# Patient Record
Sex: Male | Born: 1945 | Race: White | Hispanic: No | Marital: Married | Smoking: Former smoker
Health system: Southern US, Community
[De-identification: ages and names within clinical notes are randomized; demographics above are authoritative.]

## PROBLEM LIST (undated history)

## (undated) DIAGNOSIS — I10 Essential (primary) hypertension: Secondary | ICD-10-CM

## (undated) DIAGNOSIS — I259 Chronic ischemic heart disease, unspecified: Secondary | ICD-10-CM

## (undated) DIAGNOSIS — E119 Type 2 diabetes mellitus without complications: Secondary | ICD-10-CM

## (undated) DIAGNOSIS — R972 Elevated prostate specific antigen [PSA]: Secondary | ICD-10-CM

## (undated) DIAGNOSIS — D510 Vitamin B12 deficiency anemia due to intrinsic factor deficiency: Secondary | ICD-10-CM

## (undated) DIAGNOSIS — E669 Obesity, unspecified: Secondary | ICD-10-CM

## (undated) DIAGNOSIS — E1165 Type 2 diabetes mellitus with hyperglycemia: Secondary | ICD-10-CM

## (undated) DIAGNOSIS — N189 Chronic kidney disease, unspecified: Secondary | ICD-10-CM

## (undated) DIAGNOSIS — E1129 Type 2 diabetes mellitus with other diabetic kidney complication: Secondary | ICD-10-CM

## (undated) DIAGNOSIS — B359 Dermatophytosis, unspecified: Secondary | ICD-10-CM

## (undated) DIAGNOSIS — E782 Mixed hyperlipidemia: Secondary | ICD-10-CM

## (undated) HISTORY — DX: Dermatophytosis, unspecified: B35.9

## (undated) HISTORY — DX: Chronic kidney disease, unspecified: N18.9

## (undated) HISTORY — DX: Type 2 diabetes mellitus with hyperglycemia: E11.65

## (undated) HISTORY — DX: Mixed hyperlipidemia: E78.2

## (undated) HISTORY — DX: Vitamin B12 deficiency anemia due to intrinsic factor deficiency: D51.0

## (undated) HISTORY — PX: CARDIAC SURGERY: SHX584

## (undated) HISTORY — DX: Chronic ischemic heart disease, unspecified: I25.9

## (undated) HISTORY — DX: Type 2 diabetes mellitus with other diabetic kidney complication: E11.29

## (undated) HISTORY — DX: Obesity, unspecified: E66.9

## (undated) HISTORY — DX: Essential (primary) hypertension: I10

## (undated) HISTORY — DX: Elevated prostate specific antigen (PSA): R97.20

---

## 1987-10-31 HISTORY — PX: ANGIOPLASTY: SHX39

## 2006-10-30 HISTORY — PX: CARDIAC CATHETERIZATION: SHX172

## 2009-04-20 ENCOUNTER — Emergency Department (HOSPITAL_COMMUNITY): Admission: EM | Admit: 2009-04-20 | Discharge: 2009-04-20 | Payer: Self-pay | Admitting: Emergency Medicine

## 2011-02-06 LAB — URINALYSIS, ROUTINE W REFLEX MICROSCOPIC
Leukocytes, UA: NEGATIVE
Nitrite: NEGATIVE
Specific Gravity, Urine: 1.022 (ref 1.005–1.030)
pH: 5 (ref 5.0–8.0)

## 2011-02-06 LAB — GLUCOSE, CAPILLARY

## 2011-02-06 LAB — DIFFERENTIAL
Basophils Absolute: 0.1 10*3/uL (ref 0.0–0.1)
Lymphocytes Relative: 32 % (ref 12–46)
Lymphs Abs: 2.4 10*3/uL (ref 0.7–4.0)
Monocytes Absolute: 0.6 10*3/uL (ref 0.1–1.0)
Neutro Abs: 4.3 10*3/uL (ref 1.7–7.7)

## 2011-02-06 LAB — BASIC METABOLIC PANEL
Calcium: 9.4 mg/dL (ref 8.4–10.5)
GFR calc Af Amer: >60 mL/min (ref >60)
GFR calc non Af Amer: 50 mL/min — ABNORMAL LOW (ref >60)
Glucose, Bld: 199 mg/dL — ABNORMAL HIGH (ref 70–99)
Sodium: 137 mEq/L (ref 135–145)

## 2011-02-06 LAB — URINE MICROSCOPIC-ADD ON

## 2011-02-06 LAB — CBC
Hemoglobin: 14.1 g/dL (ref 13.0–17.0)
RDW: 13 % (ref 11.5–15.5)
WBC: 7.5 10*3/uL (ref 4.0–10.5)

## 2014-09-17 ENCOUNTER — Other Ambulatory Visit: Payer: Self-pay | Admitting: Nephrology

## 2014-09-17 DIAGNOSIS — I10 Essential (primary) hypertension: Secondary | ICD-10-CM

## 2014-09-17 DIAGNOSIS — N183 Chronic kidney disease, stage 3 unspecified: Secondary | ICD-10-CM

## 2016-12-05 DIAGNOSIS — I10 Essential (primary) hypertension: Secondary | ICD-10-CM | POA: Diagnosis not present

## 2016-12-05 DIAGNOSIS — N189 Chronic kidney disease, unspecified: Secondary | ICD-10-CM | POA: Diagnosis not present

## 2016-12-05 DIAGNOSIS — R972 Elevated prostate specific antigen [PSA]: Secondary | ICD-10-CM | POA: Diagnosis not present

## 2016-12-05 DIAGNOSIS — E1165 Type 2 diabetes mellitus with hyperglycemia: Secondary | ICD-10-CM | POA: Diagnosis not present

## 2017-04-11 DIAGNOSIS — I10 Essential (primary) hypertension: Secondary | ICD-10-CM | POA: Diagnosis not present

## 2017-04-11 DIAGNOSIS — E1165 Type 2 diabetes mellitus with hyperglycemia: Secondary | ICD-10-CM | POA: Diagnosis not present

## 2017-04-11 DIAGNOSIS — Z6829 Body mass index (BMI) 29.0-29.9, adult: Secondary | ICD-10-CM | POA: Diagnosis not present

## 2017-04-11 DIAGNOSIS — Z1389 Encounter for screening for other disorder: Secondary | ICD-10-CM | POA: Diagnosis not present

## 2017-06-25 DIAGNOSIS — E1165 Type 2 diabetes mellitus with hyperglycemia: Secondary | ICD-10-CM | POA: Diagnosis not present

## 2017-07-25 DIAGNOSIS — E119 Type 2 diabetes mellitus without complications: Secondary | ICD-10-CM | POA: Diagnosis not present

## 2017-07-25 DIAGNOSIS — I10 Essential (primary) hypertension: Secondary | ICD-10-CM | POA: Diagnosis not present

## 2017-07-25 DIAGNOSIS — N183 Chronic kidney disease, stage 3 (moderate): Secondary | ICD-10-CM | POA: Diagnosis not present

## 2017-07-25 DIAGNOSIS — N400 Enlarged prostate without lower urinary tract symptoms: Secondary | ICD-10-CM | POA: Diagnosis not present

## 2017-07-25 DIAGNOSIS — E669 Obesity, unspecified: Secondary | ICD-10-CM | POA: Diagnosis not present

## 2017-08-16 DIAGNOSIS — Z9181 History of falling: Secondary | ICD-10-CM | POA: Diagnosis not present

## 2017-08-16 DIAGNOSIS — Z6830 Body mass index (BMI) 30.0-30.9, adult: Secondary | ICD-10-CM | POA: Diagnosis not present

## 2017-08-16 DIAGNOSIS — Z1339 Encounter for screening examination for other mental health and behavioral disorders: Secondary | ICD-10-CM | POA: Diagnosis not present

## 2017-08-16 DIAGNOSIS — Z0000 Encounter for general adult medical examination without abnormal findings: Secondary | ICD-10-CM | POA: Diagnosis not present

## 2017-08-16 DIAGNOSIS — Z23 Encounter for immunization: Secondary | ICD-10-CM | POA: Diagnosis not present

## 2017-10-19 DIAGNOSIS — R972 Elevated prostate specific antigen [PSA]: Secondary | ICD-10-CM | POA: Diagnosis not present

## 2017-11-12 DIAGNOSIS — E1165 Type 2 diabetes mellitus with hyperglycemia: Secondary | ICD-10-CM | POA: Diagnosis not present

## 2017-11-12 DIAGNOSIS — Z6830 Body mass index (BMI) 30.0-30.9, adult: Secondary | ICD-10-CM | POA: Diagnosis not present

## 2017-11-12 DIAGNOSIS — R5383 Other fatigue: Secondary | ICD-10-CM | POA: Diagnosis not present

## 2017-11-12 DIAGNOSIS — I10 Essential (primary) hypertension: Secondary | ICD-10-CM | POA: Diagnosis not present

## 2018-02-25 DIAGNOSIS — Z6830 Body mass index (BMI) 30.0-30.9, adult: Secondary | ICD-10-CM | POA: Diagnosis not present

## 2018-02-25 DIAGNOSIS — E1165 Type 2 diabetes mellitus with hyperglycemia: Secondary | ICD-10-CM | POA: Diagnosis not present

## 2018-02-25 DIAGNOSIS — M79671 Pain in right foot: Secondary | ICD-10-CM | POA: Diagnosis not present

## 2018-02-25 DIAGNOSIS — I10 Essential (primary) hypertension: Secondary | ICD-10-CM | POA: Diagnosis not present

## 2018-06-03 DIAGNOSIS — E1165 Type 2 diabetes mellitus with hyperglycemia: Secondary | ICD-10-CM | POA: Diagnosis not present

## 2018-06-10 DIAGNOSIS — N189 Chronic kidney disease, unspecified: Secondary | ICD-10-CM | POA: Diagnosis not present

## 2018-06-10 DIAGNOSIS — E1165 Type 2 diabetes mellitus with hyperglycemia: Secondary | ICD-10-CM | POA: Diagnosis not present

## 2018-06-10 DIAGNOSIS — Z1331 Encounter for screening for depression: Secondary | ICD-10-CM | POA: Diagnosis not present

## 2018-06-10 DIAGNOSIS — Z6828 Body mass index (BMI) 28.0-28.9, adult: Secondary | ICD-10-CM | POA: Diagnosis not present

## 2018-06-10 DIAGNOSIS — Z1211 Encounter for screening for malignant neoplasm of colon: Secondary | ICD-10-CM | POA: Diagnosis not present

## 2018-07-24 DIAGNOSIS — Z23 Encounter for immunization: Secondary | ICD-10-CM | POA: Diagnosis not present

## 2018-08-07 DIAGNOSIS — Z1211 Encounter for screening for malignant neoplasm of colon: Secondary | ICD-10-CM | POA: Diagnosis not present

## 2018-08-09 DIAGNOSIS — N400 Enlarged prostate without lower urinary tract symptoms: Secondary | ICD-10-CM | POA: Diagnosis not present

## 2018-08-09 DIAGNOSIS — E1122 Type 2 diabetes mellitus with diabetic chronic kidney disease: Secondary | ICD-10-CM | POA: Diagnosis not present

## 2018-08-09 DIAGNOSIS — N183 Chronic kidney disease, stage 3 (moderate): Secondary | ICD-10-CM | POA: Diagnosis not present

## 2018-08-09 DIAGNOSIS — I129 Hypertensive chronic kidney disease with stage 1 through stage 4 chronic kidney disease, or unspecified chronic kidney disease: Secondary | ICD-10-CM | POA: Diagnosis not present

## 2018-08-09 DIAGNOSIS — E669 Obesity, unspecified: Secondary | ICD-10-CM | POA: Diagnosis not present

## 2018-08-26 DIAGNOSIS — E113293 Type 2 diabetes mellitus with mild nonproliferative diabetic retinopathy without macular edema, bilateral: Secondary | ICD-10-CM | POA: Diagnosis not present

## 2018-08-26 DIAGNOSIS — H40003 Preglaucoma, unspecified, bilateral: Secondary | ICD-10-CM | POA: Diagnosis not present

## 2018-08-26 DIAGNOSIS — H5203 Hypermetropia, bilateral: Secondary | ICD-10-CM | POA: Diagnosis not present

## 2018-09-11 DIAGNOSIS — E1165 Type 2 diabetes mellitus with hyperglycemia: Secondary | ICD-10-CM | POA: Diagnosis not present

## 2018-09-11 DIAGNOSIS — D510 Vitamin B12 deficiency anemia due to intrinsic factor deficiency: Secondary | ICD-10-CM | POA: Diagnosis not present

## 2018-09-11 DIAGNOSIS — Z0000 Encounter for general adult medical examination without abnormal findings: Secondary | ICD-10-CM | POA: Diagnosis not present

## 2018-09-11 DIAGNOSIS — I259 Chronic ischemic heart disease, unspecified: Secondary | ICD-10-CM | POA: Diagnosis not present

## 2018-09-11 DIAGNOSIS — I10 Essential (primary) hypertension: Secondary | ICD-10-CM | POA: Diagnosis not present

## 2018-09-11 DIAGNOSIS — N189 Chronic kidney disease, unspecified: Secondary | ICD-10-CM | POA: Diagnosis not present

## 2018-09-11 DIAGNOSIS — Z6828 Body mass index (BMI) 28.0-28.9, adult: Secondary | ICD-10-CM | POA: Diagnosis not present

## 2018-09-11 DIAGNOSIS — Z1339 Encounter for screening examination for other mental health and behavioral disorders: Secondary | ICD-10-CM | POA: Diagnosis not present

## 2018-09-28 DIAGNOSIS — N189 Chronic kidney disease, unspecified: Secondary | ICD-10-CM | POA: Diagnosis not present

## 2018-09-28 DIAGNOSIS — D510 Vitamin B12 deficiency anemia due to intrinsic factor deficiency: Secondary | ICD-10-CM | POA: Diagnosis not present

## 2018-09-28 DIAGNOSIS — E1165 Type 2 diabetes mellitus with hyperglycemia: Secondary | ICD-10-CM | POA: Diagnosis not present

## 2018-09-30 DIAGNOSIS — L259 Unspecified contact dermatitis, unspecified cause: Secondary | ICD-10-CM | POA: Diagnosis not present

## 2018-09-30 DIAGNOSIS — I10 Essential (primary) hypertension: Secondary | ICD-10-CM | POA: Diagnosis not present

## 2018-09-30 DIAGNOSIS — Z6829 Body mass index (BMI) 29.0-29.9, adult: Secondary | ICD-10-CM | POA: Diagnosis not present

## 2018-10-02 DIAGNOSIS — E1165 Type 2 diabetes mellitus with hyperglycemia: Secondary | ICD-10-CM | POA: Diagnosis not present

## 2018-10-16 DIAGNOSIS — E1165 Type 2 diabetes mellitus with hyperglycemia: Secondary | ICD-10-CM | POA: Diagnosis not present

## 2018-10-29 DIAGNOSIS — N189 Chronic kidney disease, unspecified: Secondary | ICD-10-CM | POA: Diagnosis not present

## 2018-10-29 DIAGNOSIS — I10 Essential (primary) hypertension: Secondary | ICD-10-CM | POA: Diagnosis not present

## 2018-10-29 DIAGNOSIS — E785 Hyperlipidemia, unspecified: Secondary | ICD-10-CM | POA: Diagnosis not present

## 2018-11-17 ENCOUNTER — Emergency Department (HOSPITAL_COMMUNITY)
Admission: EM | Admit: 2018-11-17 | Discharge: 2018-11-17 | Disposition: A | Discharge status: Home / Self Care | Payer: PPO | Attending: Emergency Medicine | Admitting: Emergency Medicine

## 2018-11-17 ENCOUNTER — Other Ambulatory Visit: Payer: Self-pay

## 2018-11-17 ENCOUNTER — Encounter (HOSPITAL_COMMUNITY): Payer: Self-pay

## 2018-11-17 ENCOUNTER — Emergency Department (HOSPITAL_COMMUNITY): Payer: PPO

## 2018-11-17 DIAGNOSIS — I10 Essential (primary) hypertension: Secondary | ICD-10-CM | POA: Diagnosis not present

## 2018-11-17 DIAGNOSIS — Z794 Long term (current) use of insulin: Secondary | ICD-10-CM | POA: Insufficient documentation

## 2018-11-17 DIAGNOSIS — E119 Type 2 diabetes mellitus without complications: Secondary | ICD-10-CM | POA: Diagnosis not present

## 2018-11-17 DIAGNOSIS — R001 Bradycardia, unspecified: Secondary | ICD-10-CM | POA: Diagnosis not present

## 2018-11-17 DIAGNOSIS — R42 Dizziness and giddiness: Secondary | ICD-10-CM | POA: Diagnosis not present

## 2018-11-17 DIAGNOSIS — E1165 Type 2 diabetes mellitus with hyperglycemia: Secondary | ICD-10-CM | POA: Diagnosis not present

## 2018-11-17 DIAGNOSIS — R55 Syncope and collapse: Secondary | ICD-10-CM | POA: Insufficient documentation

## 2018-11-17 DIAGNOSIS — Z87891 Personal history of nicotine dependence: Secondary | ICD-10-CM | POA: Diagnosis not present

## 2018-11-17 DIAGNOSIS — N179 Acute kidney failure, unspecified: Secondary | ICD-10-CM | POA: Diagnosis not present

## 2018-11-17 DIAGNOSIS — I959 Hypotension, unspecified: Secondary | ICD-10-CM | POA: Diagnosis not present

## 2018-11-17 HISTORY — DX: Essential (primary) hypertension: I10

## 2018-11-17 HISTORY — DX: Type 2 diabetes mellitus without complications: E11.9

## 2018-11-17 LAB — URINALYSIS, ROUTINE W REFLEX MICROSCOPIC
Bilirubin Urine: NEGATIVE
Glucose, UA: >=500 mg/dL — AB
Hgb urine dipstick: NEGATIVE
Ketones, ur: NEGATIVE mg/dL
Leukocytes, UA: NEGATIVE
Nitrite: NEGATIVE
Protein, ur: NEGATIVE mg/dL
Specific Gravity, Urine: 1.023 (ref 1.005–1.030)
pH: 5 (ref 5.0–8.0)

## 2018-11-17 LAB — CBC WITH DIFFERENTIAL/PLATELET
ABS IMMATURE GRANULOCYTES: 0.02 10*3/uL (ref 0.00–0.07)
Basophils Absolute: 0.1 10*3/uL (ref 0.0–0.1)
Basophils Relative: 1 %
EOS ABS: 0.1 10*3/uL (ref 0.0–0.5)
Eosinophils Relative: 1 %
HCT: 41.1 % (ref 39.0–52.0)
Hemoglobin: 13.1 g/dL (ref 13.0–17.0)
Immature Granulocytes: 0 %
Lymphocytes Relative: 15 %
Lymphs Abs: 1.2 10*3/uL (ref 0.7–4.0)
MCH: 30.5 pg (ref 26.0–34.0)
MCHC: 31.9 g/dL (ref 30.0–36.0)
MCV: 95.8 fL (ref 80.0–100.0)
Monocytes Absolute: 0.6 10*3/uL (ref 0.1–1.0)
Monocytes Relative: 7 %
Neutro Abs: 5.8 10*3/uL (ref 1.7–7.7)
Neutrophils Relative %: 76 %
Platelets: 154 10*3/uL (ref 150–400)
RBC: 4.29 MIL/uL (ref 4.22–5.81)
RDW: 11.9 % (ref 11.5–15.5)
WBC: 7.7 10*3/uL (ref 4.0–10.5)
nRBC: 0 % (ref 0.0–0.2)

## 2018-11-17 LAB — I-STAT TROPONIN, ED: Troponin i, poc: 0.01 ng/mL (ref 0.00–0.08)

## 2018-11-17 LAB — COMPREHENSIVE METABOLIC PANEL
ALBUMIN: 3.4 g/dL — AB (ref 3.5–5.0)
ALT: 20 U/L (ref 0–44)
AST: 18 U/L (ref 15–41)
Alkaline Phosphatase: 65 U/L (ref 38–126)
Anion gap: 8 (ref 5–15)
BUN: 33 mg/dL — ABNORMAL HIGH (ref 8–23)
CO2: 19 mmol/L — ABNORMAL LOW (ref 22–32)
CREATININE: 1.79 mg/dL — AB (ref 0.61–1.24)
Calcium: 8.4 mg/dL — ABNORMAL LOW (ref 8.9–10.3)
Chloride: 109 mmol/L (ref 98–111)
GFR calc Af Amer: 43 mL/min — ABNORMAL LOW (ref >60)
GFR calc non Af Amer: 37 mL/min — ABNORMAL LOW (ref >60)
Glucose, Bld: 301 mg/dL — ABNORMAL HIGH (ref 70–99)
Potassium: 5 mmol/L (ref 3.5–5.1)
Sodium: 136 mmol/L (ref 135–145)
Total Bilirubin: 0.6 mg/dL (ref 0.3–1.2)
Total Protein: 5.9 g/dL — ABNORMAL LOW (ref 6.5–8.1)

## 2018-11-17 MED ORDER — SODIUM CHLORIDE 0.9 % IV BOLUS
1000.0000 mL | Freq: Once | INTRAVENOUS | Status: AC
  Administered 2018-11-17: 1000 mL via INTRAVENOUS

## 2018-11-17 NOTE — ED Notes (Signed)
Patient transported to X-ray 

## 2018-11-17 NOTE — ED Notes (Signed)
Pt alert and oriented in NAD. Pt verbalized understanding of discharge instructions. 

## 2018-11-17 NOTE — Discharge Instructions (Signed)
Hold your blood pressure medicines for now. Especially hold your atenolol.   Your kidney function is slightly abnormal so you need to stay hydrated/.    Call your doctor tomorrow for follow up in 1-2 days. Hold off taking your blood pressure medicines until you talk to you doctor  Return to ER if you have dizziness, passing out, chest pain, palpitations, trouble breathing

## 2018-11-17 NOTE — ED Provider Notes (Addendum)
[facility] EMERGENCY DEPARTMENT Provider Note   CSN: [id number] Arrival date & time: 11/17/18  1153     History   Chief Complaint Chief Complaint  Patient presents with  . Loss of Consciousness    HPI Brady [name] is a 73 y.o. male hx of DM, HTN, here presenting with dizziness, hypotension, near syncope.  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  States that he is compliant with his atenolol as well as clonidine and took it this morning.  He states that he then went to church and felt very lightheaded and dizzy when he got up and almost passed out.  He states that his wife lowered him to the floor and he did not hit his head.  He felt nauseated at that time but denies any abdominal pain or chest pain or palpitations.  Patient was noted to be hypotensive with blood pressure 78 over palp initially and was given 700 cc by EMS and BP was 126/55 on arrival. Denies vomiting or fevers.   The history is provided by the patient.    Past Medical History:  Diagnosis Date  . Diabetes mellitus without complication ([place])   . Hypertension     There are no active problems to display for this patient.     Home Medications    Prior to Admission medications   Medication Sig Start Date End Date Taking? Authorizing Provider  amLODipine (NORVASC) 10 MG tablet Take 10 mg by mouth daily. 08/20/18  Yes [provider]  atenolol (TENORMIN) 25 MG tablet Take 25 mg by mouth daily. 08/28/18  Yes [provider]  benazepril (LOTENSIN) 40 MG tablet Take 40 mg by mouth daily. 08/28/18  Yes [provider]  canagliflozin (INVOKANA) 100 MG TABS tablet Take 100 mg by mouth daily before breakfast.   Yes [provider]  LANTUS SOLOSTAR 100 UNIT/ML Solostar Pen Inject 4-14 Units into the skin See admin instructions. 14 units in the morning and 4 units at bedtime 09/18/18  Yes [provider]  pravastatin (PRAVACHOL) 10 MG tablet Take 10  mg by mouth at bedtime. 09/05/18  Yes [provider]  tamsulosin (FLOMAX) 0.4 MG CAPS capsule Take 0.4 mg by mouth at bedtime. 08/20/18  Yes [provider]    Family History No family history on file.  Social History Social History   Tobacco Use  . Smoking status: Former Research scientist (life sciences)  . Smokeless tobacco: Never Used  Substance Use Topics  . Alcohol use: Never    Frequency: Never  . Drug use: Never     Allergies   Patient has no known allergies.   Review of Systems Review of Systems  Cardiovascular: Positive for syncope.  Neurological: Positive for dizziness and syncope.  All other systems reviewed and are negative.    Physical Exam Updated Vital Signs BP (!) 133/56   Pulse (!) 46   Temp (!) 97.5 F (36.4 C) (Oral)   Resp 11   Ht 5\' 4"  (1.626 m)   Wt 72.6 kg   SpO2 100%   BMI 27.46 kg/m   Physical Exam Vitals signs and nursing note reviewed.  Constitutional:      Appearance: Normal appearance.  HENT:     Head: Normocephalic.     Nose: Nose normal.     Mouth/Throat:     Mouth: Mucous membranes are dry.     Comments: MM slightly dry  Eyes:     Extraocular Movements: Extraocular movements intact.  Pupils: Pupils are equal, round, and reactive to light.  Neck:     Musculoskeletal: Normal range of motion.  Cardiovascular:     Rate and Rhythm: Normal rate and regular rhythm.  Pulmonary:     Effort: Pulmonary effort is normal.     Breath sounds: Normal breath sounds.  Abdominal:     General: Abdomen is flat.     Palpations: Abdomen is soft.     Comments: No pulsatile mass   Musculoskeletal: Normal range of motion.  Skin:    General: Skin is warm.     Capillary Refill: Capillary refill takes less than 2 seconds.  Neurological:     General: No focal deficit present.     Mental Status: He is alert and oriented to person, place, and time.  Psychiatric:        Mood and Affect: Mood normal.        Behavior: Behavior normal.      ED  Treatments / Results  Labs (all labs ordered are listed, but only abnormal results are displayed) Labs Reviewed  COMPREHENSIVE METABOLIC PANEL - Abnormal; Notable for the following components:      Result Value   CO2 19 (*)    Glucose, Bld 301 (*)    BUN 33 (*)    Creatinine, Ser 1.79 (*)    Calcium 8.4 (*)    Total Protein 5.9 (*)    Albumin 3.4 (*)    GFR calc non Af Amer 37 (*)    GFR calc Af Amer 43 (*)    All other components within normal limits  URINALYSIS, ROUTINE W REFLEX MICROSCOPIC - Abnormal; Notable for the following components:   Glucose, UA >=500 (*)    Bacteria, UA RARE (*)    All other components within normal limits  CBC WITH DIFFERENTIAL/PLATELET  I-STAT TROPONIN, ED    EKG EKG Interpretation  Date/Time:  Sunday November 17 2018 11:54:06 EST Ventricular Rate:  45 PR Interval:    QRS Duration: 115 QT Interval:  499 QTC Calculation: 432 R Axis:   -8 Text Interpretation:  Sinus bradycardia Nonspecific intraventricular conduction delay No significant change since last tracing Confirmed by Wandra Arthurs [phone]) on 11/17/2018 11:57:43 AM   Radiology Dg Chest 2 View  Result Date: 11/17/2018 CLINICAL DATA:  Recent syncopal episode EXAM: CHEST - 2 VIEW COMPARISON:  None. FINDINGS: The heart size and mediastinal contours are within normal limits. Both lungs are clear. The visualized skeletal structures are unremarkable. IMPRESSION: No active cardiopulmonary disease. Electronically Signed   By: Inez Catalina M.D.   On: 11/17/2018 14:31    Procedures Procedures (including critical care time)  Medications Ordered in ED Medications  sodium chloride 0.9 % bolus 1,000 mL (0 mLs Intravenous Stopped 11/17/18 1242)     Initial Impression / Assessment and Plan / ED Course  I have reviewed the triage vital signs and the nursing notes.  Pertinent labs & imaging results that were available during my care of the patient were reviewed by me and considered in my medical  decision making (see chart for details).    Brady [name] is a 73 y.o. male here with near syncope. Patient is bradycardic to 40s to 50s. EKG showed no heart block. I think likely symptomatic bradycardia vs some dehydration.   3:01 PM   Cr elevated to 1.8 from 1.3. UA nl. CXR clear. Given IVF and BP stable at 130s. Stable for discharge. Told him to drink plenty of liquids.  Will have him hold his BP meds and have him call his PCP tomorrow.    Final Clinical Impressions(s) / ED Diagnoses   Final diagnoses:  Near syncope  AKI (acute kidney injury) [facility])    ED Discharge Orders    None       Drenda Freeze, MD 11/17/18 1459    Drenda Freeze, MD 11/17/18 1501

## 2018-11-17 NOTE — ED Triage Notes (Signed)
Pt had syncopal episode episode at church, lowered to ground. Complains of weakness and nausea. 78 bp pal hr 49 norm 40-50. Given 722ml NS. Weak for 1 month since starting invocana.

## 2018-11-26 DIAGNOSIS — J40 Bronchitis, not specified as acute or chronic: Secondary | ICD-10-CM | POA: Diagnosis not present

## 2018-11-26 DIAGNOSIS — R55 Syncope and collapse: Secondary | ICD-10-CM | POA: Diagnosis not present

## 2018-11-26 DIAGNOSIS — J329 Chronic sinusitis, unspecified: Secondary | ICD-10-CM | POA: Diagnosis not present

## 2018-11-26 DIAGNOSIS — Z6828 Body mass index (BMI) 28.0-28.9, adult: Secondary | ICD-10-CM | POA: Diagnosis not present

## 2018-11-28 DIAGNOSIS — E1165 Type 2 diabetes mellitus with hyperglycemia: Secondary | ICD-10-CM | POA: Diagnosis not present

## 2018-11-28 DIAGNOSIS — I10 Essential (primary) hypertension: Secondary | ICD-10-CM | POA: Diagnosis not present

## 2018-11-28 DIAGNOSIS — D510 Vitamin B12 deficiency anemia due to intrinsic factor deficiency: Secondary | ICD-10-CM | POA: Diagnosis not present

## 2018-12-24 DIAGNOSIS — E1165 Type 2 diabetes mellitus with hyperglycemia: Secondary | ICD-10-CM | POA: Diagnosis not present

## 2018-12-31 DIAGNOSIS — E1159 Type 2 diabetes mellitus with other circulatory complications: Secondary | ICD-10-CM | POA: Diagnosis not present

## 2018-12-31 DIAGNOSIS — Z9181 History of falling: Secondary | ICD-10-CM | POA: Diagnosis not present

## 2018-12-31 DIAGNOSIS — N189 Chronic kidney disease, unspecified: Secondary | ICD-10-CM | POA: Diagnosis not present

## 2018-12-31 DIAGNOSIS — I10 Essential (primary) hypertension: Secondary | ICD-10-CM | POA: Diagnosis not present

## 2018-12-31 DIAGNOSIS — E7800 Pure hypercholesterolemia, unspecified: Secondary | ICD-10-CM | POA: Diagnosis not present

## 2018-12-31 DIAGNOSIS — Z6828 Body mass index (BMI) 28.0-28.9, adult: Secondary | ICD-10-CM | POA: Diagnosis not present

## 2018-12-31 DIAGNOSIS — E1165 Type 2 diabetes mellitus with hyperglycemia: Secondary | ICD-10-CM | POA: Diagnosis not present

## 2019-01-10 DIAGNOSIS — R972 Elevated prostate specific antigen [PSA]: Secondary | ICD-10-CM | POA: Diagnosis not present

## 2019-01-17 DIAGNOSIS — N401 Enlarged prostate with lower urinary tract symptoms: Secondary | ICD-10-CM | POA: Diagnosis not present

## 2019-01-17 DIAGNOSIS — R972 Elevated prostate specific antigen [PSA]: Secondary | ICD-10-CM | POA: Diagnosis not present

## 2019-01-17 DIAGNOSIS — R3912 Poor urinary stream: Secondary | ICD-10-CM | POA: Diagnosis not present

## 2019-01-28 DIAGNOSIS — I10 Essential (primary) hypertension: Secondary | ICD-10-CM | POA: Diagnosis not present

## 2019-01-28 DIAGNOSIS — E1165 Type 2 diabetes mellitus with hyperglycemia: Secondary | ICD-10-CM | POA: Diagnosis not present

## 2019-04-16 DIAGNOSIS — E1165 Type 2 diabetes mellitus with hyperglycemia: Secondary | ICD-10-CM | POA: Diagnosis not present

## 2019-04-22 DIAGNOSIS — E7800 Pure hypercholesterolemia, unspecified: Secondary | ICD-10-CM | POA: Diagnosis not present

## 2019-04-22 DIAGNOSIS — Z6829 Body mass index (BMI) 29.0-29.9, adult: Secondary | ICD-10-CM | POA: Diagnosis not present

## 2019-04-22 DIAGNOSIS — N189 Chronic kidney disease, unspecified: Secondary | ICD-10-CM | POA: Diagnosis not present

## 2019-04-22 DIAGNOSIS — E1165 Type 2 diabetes mellitus with hyperglycemia: Secondary | ICD-10-CM | POA: Diagnosis not present

## 2019-04-22 DIAGNOSIS — I10 Essential (primary) hypertension: Secondary | ICD-10-CM | POA: Diagnosis not present

## 2019-04-25 DIAGNOSIS — R972 Elevated prostate specific antigen [PSA]: Secondary | ICD-10-CM | POA: Diagnosis not present

## 2019-07-16 DIAGNOSIS — E1165 Type 2 diabetes mellitus with hyperglycemia: Secondary | ICD-10-CM | POA: Diagnosis not present

## 2019-07-23 DIAGNOSIS — Z9119 Patient's noncompliance with other medical treatment and regimen: Secondary | ICD-10-CM | POA: Diagnosis not present

## 2019-07-23 DIAGNOSIS — Z6829 Body mass index (BMI) 29.0-29.9, adult: Secondary | ICD-10-CM | POA: Diagnosis not present

## 2019-07-23 DIAGNOSIS — E7800 Pure hypercholesterolemia, unspecified: Secondary | ICD-10-CM | POA: Diagnosis not present

## 2019-07-23 DIAGNOSIS — Z23 Encounter for immunization: Secondary | ICD-10-CM | POA: Diagnosis not present

## 2019-07-23 DIAGNOSIS — E1165 Type 2 diabetes mellitus with hyperglycemia: Secondary | ICD-10-CM | POA: Diagnosis not present

## 2019-07-23 DIAGNOSIS — I10 Essential (primary) hypertension: Secondary | ICD-10-CM | POA: Diagnosis not present

## 2019-07-30 DIAGNOSIS — I429 Cardiomyopathy, unspecified: Secondary | ICD-10-CM | POA: Diagnosis not present

## 2019-07-30 DIAGNOSIS — Z7901 Long term (current) use of anticoagulants: Secondary | ICD-10-CM | POA: Diagnosis not present

## 2019-08-30 DIAGNOSIS — E782 Mixed hyperlipidemia: Secondary | ICD-10-CM | POA: Diagnosis not present

## 2019-08-30 DIAGNOSIS — E1165 Type 2 diabetes mellitus with hyperglycemia: Secondary | ICD-10-CM | POA: Diagnosis not present

## 2019-08-30 DIAGNOSIS — I10 Essential (primary) hypertension: Secondary | ICD-10-CM | POA: Diagnosis not present

## 2019-09-03 DIAGNOSIS — R972 Elevated prostate specific antigen [PSA]: Secondary | ICD-10-CM | POA: Diagnosis not present

## 2019-09-10 DIAGNOSIS — N401 Enlarged prostate with lower urinary tract symptoms: Secondary | ICD-10-CM | POA: Diagnosis not present

## 2019-09-10 DIAGNOSIS — R972 Elevated prostate specific antigen [PSA]: Secondary | ICD-10-CM | POA: Diagnosis not present

## 2019-09-10 DIAGNOSIS — R3912 Poor urinary stream: Secondary | ICD-10-CM | POA: Diagnosis not present

## 2019-09-29 DIAGNOSIS — E1165 Type 2 diabetes mellitus with hyperglycemia: Secondary | ICD-10-CM | POA: Diagnosis not present

## 2019-09-29 DIAGNOSIS — I10 Essential (primary) hypertension: Secondary | ICD-10-CM | POA: Diagnosis not present

## 2019-09-29 DIAGNOSIS — E7800 Pure hypercholesterolemia, unspecified: Secondary | ICD-10-CM | POA: Diagnosis not present

## 2019-11-12 DIAGNOSIS — Z6830 Body mass index (BMI) 30.0-30.9, adult: Secondary | ICD-10-CM | POA: Diagnosis not present

## 2019-11-12 DIAGNOSIS — E1165 Type 2 diabetes mellitus with hyperglycemia: Secondary | ICD-10-CM | POA: Diagnosis not present

## 2019-11-12 DIAGNOSIS — I10 Essential (primary) hypertension: Secondary | ICD-10-CM | POA: Diagnosis not present

## 2019-11-12 DIAGNOSIS — N189 Chronic kidney disease, unspecified: Secondary | ICD-10-CM | POA: Diagnosis not present

## 2019-11-12 DIAGNOSIS — Z79899 Other long term (current) drug therapy: Secondary | ICD-10-CM | POA: Diagnosis not present

## 2019-11-12 DIAGNOSIS — D510 Vitamin B12 deficiency anemia due to intrinsic factor deficiency: Secondary | ICD-10-CM | POA: Diagnosis not present

## 2019-11-12 DIAGNOSIS — Z1331 Encounter for screening for depression: Secondary | ICD-10-CM | POA: Diagnosis not present

## 2019-11-12 DIAGNOSIS — Z0000 Encounter for general adult medical examination without abnormal findings: Secondary | ICD-10-CM | POA: Diagnosis not present

## 2019-11-12 DIAGNOSIS — D519 Vitamin B12 deficiency anemia, unspecified: Secondary | ICD-10-CM | POA: Diagnosis not present

## 2019-11-12 DIAGNOSIS — I259 Chronic ischemic heart disease, unspecified: Secondary | ICD-10-CM | POA: Diagnosis not present

## 2019-11-29 DIAGNOSIS — E1165 Type 2 diabetes mellitus with hyperglycemia: Secondary | ICD-10-CM | POA: Diagnosis not present

## 2019-11-29 DIAGNOSIS — I10 Essential (primary) hypertension: Secondary | ICD-10-CM | POA: Diagnosis not present

## 2019-11-29 DIAGNOSIS — D519 Vitamin B12 deficiency anemia, unspecified: Secondary | ICD-10-CM | POA: Diagnosis not present

## 2020-01-28 DIAGNOSIS — I10 Essential (primary) hypertension: Secondary | ICD-10-CM | POA: Diagnosis not present

## 2020-01-28 DIAGNOSIS — E782 Mixed hyperlipidemia: Secondary | ICD-10-CM | POA: Diagnosis not present

## 2020-01-29 IMAGING — DX DG CHEST 2V
2 series · 2 of 2 positions shown · non-contrast
Comparison: None.

CLINICAL DATA: Recent syncopal episode

EXAM:
CHEST - 2 VIEW

[chest pa]
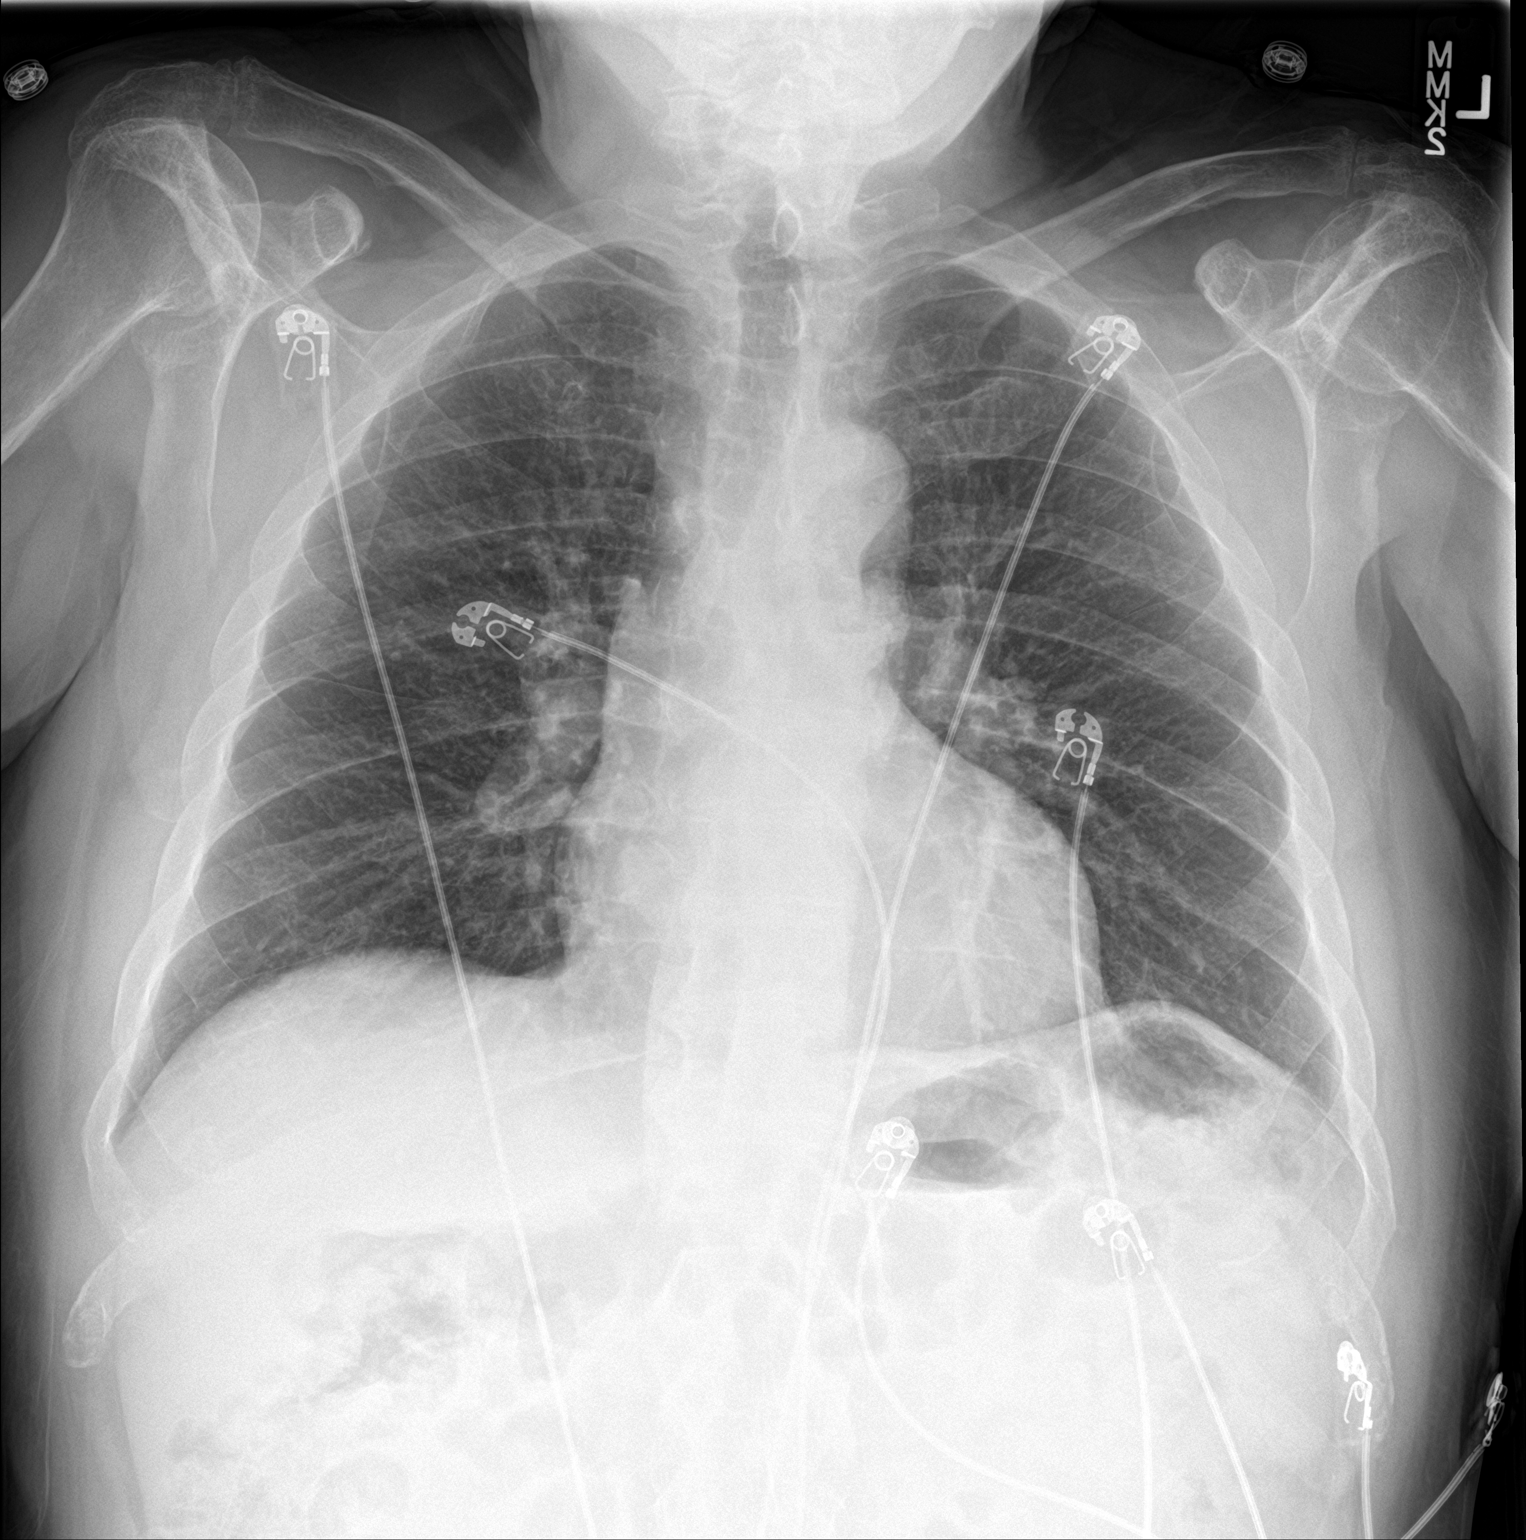

[chest lat]
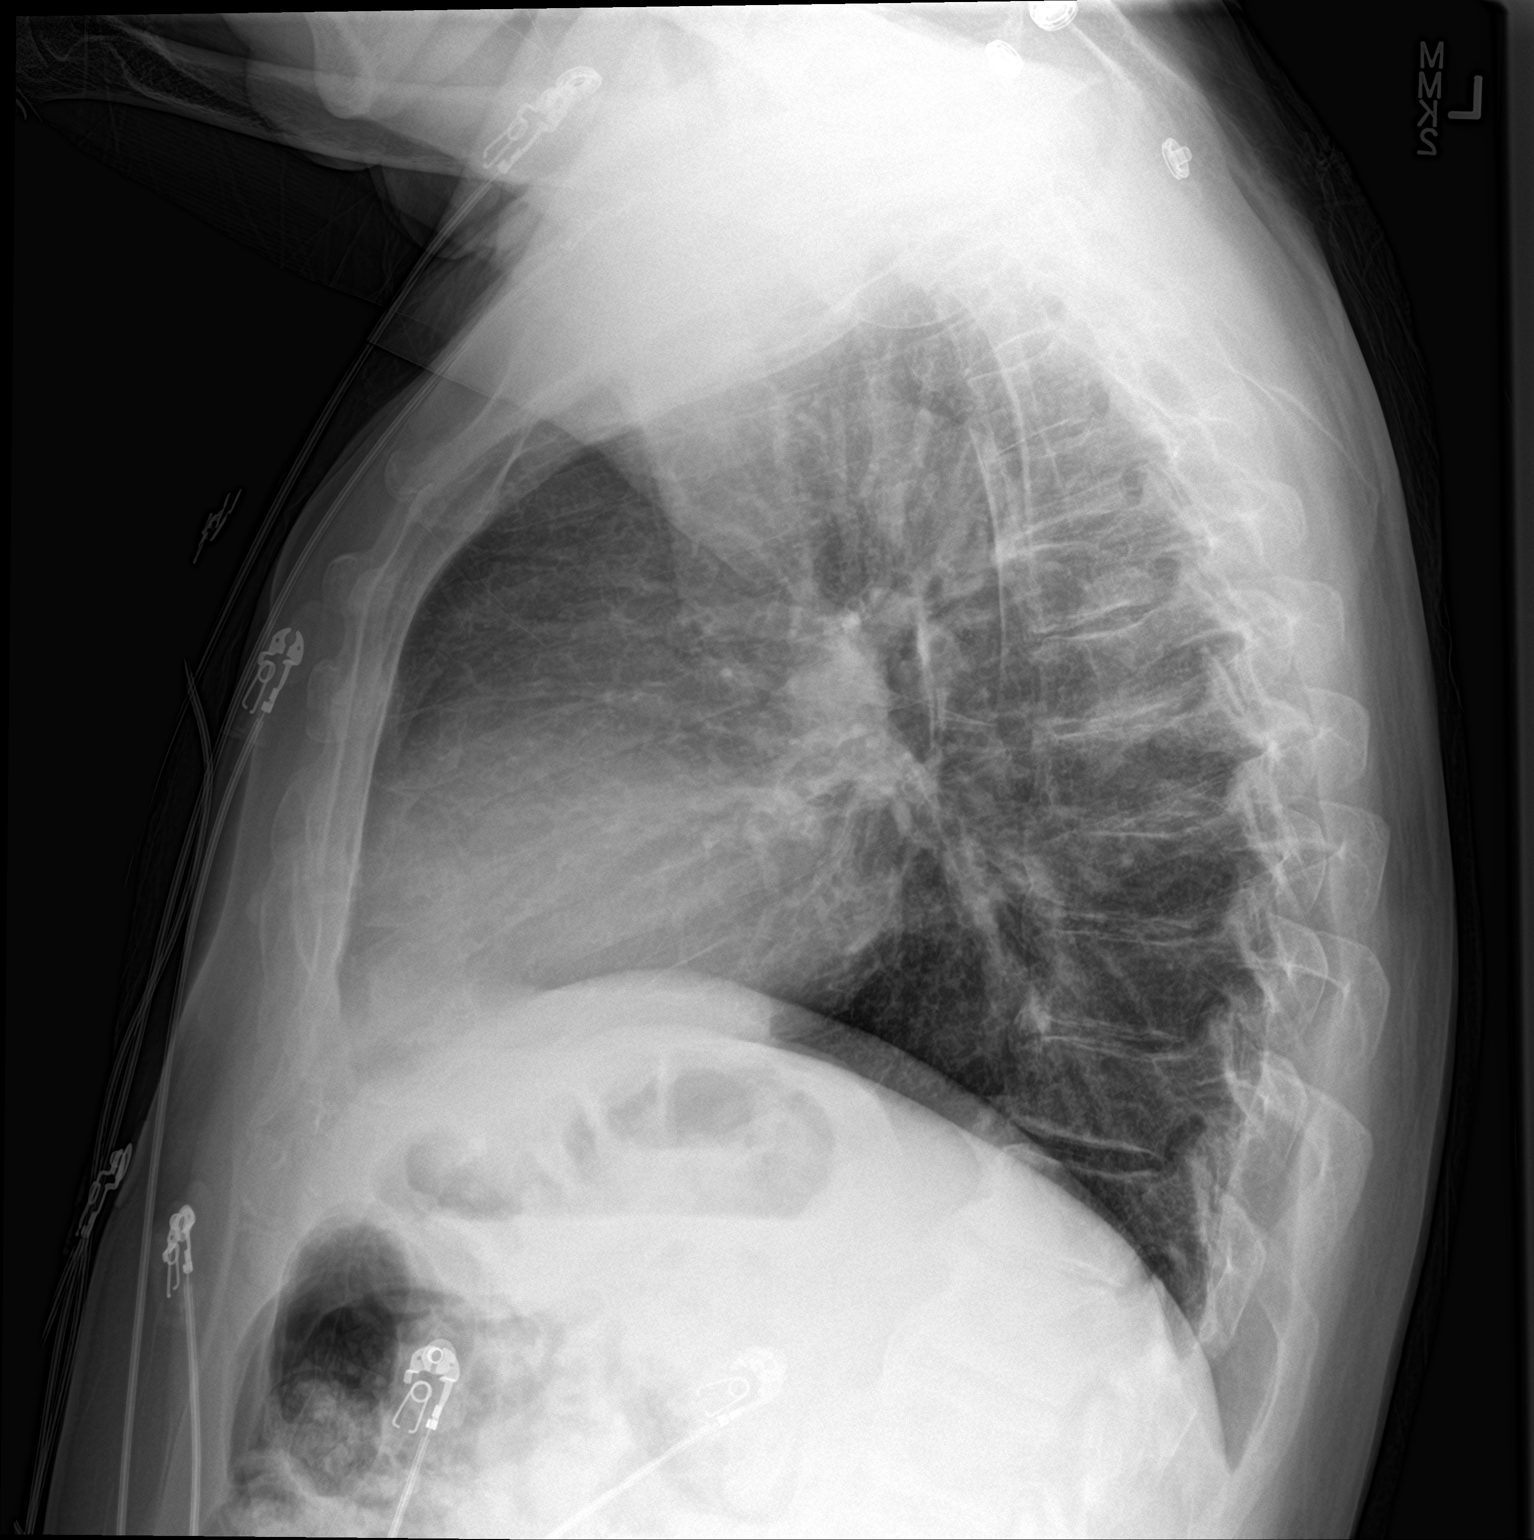

[2 of 2 positions shown; findings below may reference images not displayed]

FINDINGS: The heart size and mediastinal contours are within normal limits.
Both lungs are clear. The visualized skeletal structures are
unremarkable.
IMPRESSION: No active cardiopulmonary disease.

## 2020-02-02 DIAGNOSIS — I10 Essential (primary) hypertension: Secondary | ICD-10-CM | POA: Diagnosis not present

## 2020-02-02 DIAGNOSIS — Z6830 Body mass index (BMI) 30.0-30.9, adult: Secondary | ICD-10-CM | POA: Diagnosis not present

## 2020-02-02 DIAGNOSIS — Z9119 Patient's noncompliance with other medical treatment and regimen: Secondary | ICD-10-CM | POA: Diagnosis not present

## 2020-02-02 DIAGNOSIS — E1165 Type 2 diabetes mellitus with hyperglycemia: Secondary | ICD-10-CM | POA: Diagnosis not present

## 2020-02-02 DIAGNOSIS — E7800 Pure hypercholesterolemia, unspecified: Secondary | ICD-10-CM | POA: Diagnosis not present

## 2020-02-02 DIAGNOSIS — N189 Chronic kidney disease, unspecified: Secondary | ICD-10-CM | POA: Diagnosis not present

## 2020-02-02 DIAGNOSIS — Z9181 History of falling: Secondary | ICD-10-CM | POA: Diagnosis not present

## 2020-02-02 DIAGNOSIS — Z1331 Encounter for screening for depression: Secondary | ICD-10-CM | POA: Diagnosis not present

## 2020-03-10 DIAGNOSIS — R972 Elevated prostate specific antigen [PSA]: Secondary | ICD-10-CM | POA: Diagnosis not present

## 2020-03-17 DIAGNOSIS — N401 Enlarged prostate with lower urinary tract symptoms: Secondary | ICD-10-CM | POA: Diagnosis not present

## 2020-03-17 DIAGNOSIS — R3912 Poor urinary stream: Secondary | ICD-10-CM | POA: Diagnosis not present

## 2020-03-17 DIAGNOSIS — R972 Elevated prostate specific antigen [PSA]: Secondary | ICD-10-CM | POA: Diagnosis not present

## 2020-05-05 DIAGNOSIS — E1165 Type 2 diabetes mellitus with hyperglycemia: Secondary | ICD-10-CM | POA: Diagnosis not present

## 2020-05-11 DIAGNOSIS — N189 Chronic kidney disease, unspecified: Secondary | ICD-10-CM | POA: Diagnosis not present

## 2020-05-11 DIAGNOSIS — E7800 Pure hypercholesterolemia, unspecified: Secondary | ICD-10-CM | POA: Diagnosis not present

## 2020-05-11 DIAGNOSIS — I10 Essential (primary) hypertension: Secondary | ICD-10-CM | POA: Diagnosis not present

## 2020-05-11 DIAGNOSIS — Z6829 Body mass index (BMI) 29.0-29.9, adult: Secondary | ICD-10-CM | POA: Diagnosis not present

## 2020-05-11 DIAGNOSIS — E1165 Type 2 diabetes mellitus with hyperglycemia: Secondary | ICD-10-CM | POA: Diagnosis not present

## 2020-06-12 DIAGNOSIS — U071 COVID-19: Secondary | ICD-10-CM | POA: Diagnosis not present

## 2020-08-06 DIAGNOSIS — E1165 Type 2 diabetes mellitus with hyperglycemia: Secondary | ICD-10-CM | POA: Diagnosis not present

## 2020-08-13 DIAGNOSIS — N189 Chronic kidney disease, unspecified: Secondary | ICD-10-CM | POA: Diagnosis not present

## 2020-08-13 DIAGNOSIS — Z6828 Body mass index (BMI) 28.0-28.9, adult: Secondary | ICD-10-CM | POA: Diagnosis not present

## 2020-08-13 DIAGNOSIS — Z23 Encounter for immunization: Secondary | ICD-10-CM | POA: Diagnosis not present

## 2020-08-13 DIAGNOSIS — E7800 Pure hypercholesterolemia, unspecified: Secondary | ICD-10-CM | POA: Diagnosis not present

## 2020-08-13 DIAGNOSIS — E1165 Type 2 diabetes mellitus with hyperglycemia: Secondary | ICD-10-CM | POA: Diagnosis not present

## 2020-12-21 DIAGNOSIS — R972 Elevated prostate specific antigen [PSA]: Secondary | ICD-10-CM | POA: Diagnosis not present

## 2020-12-21 DIAGNOSIS — I259 Chronic ischemic heart disease, unspecified: Secondary | ICD-10-CM | POA: Diagnosis not present

## 2020-12-21 DIAGNOSIS — Z0000 Encounter for general adult medical examination without abnormal findings: Secondary | ICD-10-CM | POA: Diagnosis not present

## 2020-12-21 DIAGNOSIS — N189 Chronic kidney disease, unspecified: Secondary | ICD-10-CM | POA: Diagnosis not present

## 2020-12-21 DIAGNOSIS — I10 Essential (primary) hypertension: Secondary | ICD-10-CM | POA: Diagnosis not present

## 2020-12-21 DIAGNOSIS — Z6829 Body mass index (BMI) 29.0-29.9, adult: Secondary | ICD-10-CM | POA: Diagnosis not present

## 2020-12-21 DIAGNOSIS — E1165 Type 2 diabetes mellitus with hyperglycemia: Secondary | ICD-10-CM | POA: Diagnosis not present

## 2021-02-25 DIAGNOSIS — R972 Elevated prostate specific antigen [PSA]: Secondary | ICD-10-CM | POA: Diagnosis not present

## 2021-02-26 DIAGNOSIS — D510 Vitamin B12 deficiency anemia due to intrinsic factor deficiency: Secondary | ICD-10-CM | POA: Diagnosis not present

## 2021-02-26 DIAGNOSIS — E7800 Pure hypercholesterolemia, unspecified: Secondary | ICD-10-CM | POA: Diagnosis not present

## 2021-02-26 DIAGNOSIS — I10 Essential (primary) hypertension: Secondary | ICD-10-CM | POA: Diagnosis not present

## 2021-03-04 DIAGNOSIS — N401 Enlarged prostate with lower urinary tract symptoms: Secondary | ICD-10-CM | POA: Diagnosis not present

## 2021-03-04 DIAGNOSIS — R3912 Poor urinary stream: Secondary | ICD-10-CM | POA: Diagnosis not present

## 2021-03-04 DIAGNOSIS — R972 Elevated prostate specific antigen [PSA]: Secondary | ICD-10-CM | POA: Diagnosis not present

## 2021-03-28 DIAGNOSIS — N189 Chronic kidney disease, unspecified: Secondary | ICD-10-CM | POA: Diagnosis not present

## 2021-03-28 DIAGNOSIS — I259 Chronic ischemic heart disease, unspecified: Secondary | ICD-10-CM | POA: Diagnosis not present

## 2021-04-05 DIAGNOSIS — E1165 Type 2 diabetes mellitus with hyperglycemia: Secondary | ICD-10-CM | POA: Diagnosis not present

## 2021-04-12 DIAGNOSIS — N189 Chronic kidney disease, unspecified: Secondary | ICD-10-CM | POA: Diagnosis not present

## 2021-04-12 DIAGNOSIS — E1165 Type 2 diabetes mellitus with hyperglycemia: Secondary | ICD-10-CM | POA: Diagnosis not present

## 2021-04-12 DIAGNOSIS — Z9119 Patient's noncompliance with other medical treatment and regimen: Secondary | ICD-10-CM | POA: Diagnosis not present

## 2021-04-12 DIAGNOSIS — Z9181 History of falling: Secondary | ICD-10-CM | POA: Diagnosis not present

## 2021-04-12 DIAGNOSIS — Z6828 Body mass index (BMI) 28.0-28.9, adult: Secondary | ICD-10-CM | POA: Diagnosis not present

## 2021-04-12 DIAGNOSIS — Z1331 Encounter for screening for depression: Secondary | ICD-10-CM | POA: Diagnosis not present

## 2021-04-12 DIAGNOSIS — N400 Enlarged prostate without lower urinary tract symptoms: Secondary | ICD-10-CM | POA: Diagnosis not present

## 2021-07-05 DIAGNOSIS — E1165 Type 2 diabetes mellitus with hyperglycemia: Secondary | ICD-10-CM | POA: Diagnosis not present

## 2021-07-11 DIAGNOSIS — E1165 Type 2 diabetes mellitus with hyperglycemia: Secondary | ICD-10-CM | POA: Diagnosis not present

## 2021-07-11 DIAGNOSIS — I10 Essential (primary) hypertension: Secondary | ICD-10-CM | POA: Diagnosis not present

## 2021-07-11 DIAGNOSIS — E7800 Pure hypercholesterolemia, unspecified: Secondary | ICD-10-CM | POA: Diagnosis not present

## 2021-07-11 DIAGNOSIS — Z6828 Body mass index (BMI) 28.0-28.9, adult: Secondary | ICD-10-CM | POA: Diagnosis not present

## 2021-07-11 DIAGNOSIS — Z794 Long term (current) use of insulin: Secondary | ICD-10-CM | POA: Diagnosis not present

## 2021-10-06 DIAGNOSIS — Z794 Long term (current) use of insulin: Secondary | ICD-10-CM | POA: Diagnosis not present

## 2021-10-06 DIAGNOSIS — E1165 Type 2 diabetes mellitus with hyperglycemia: Secondary | ICD-10-CM | POA: Diagnosis not present

## 2021-10-13 DIAGNOSIS — Z6829 Body mass index (BMI) 29.0-29.9, adult: Secondary | ICD-10-CM | POA: Diagnosis not present

## 2021-10-13 DIAGNOSIS — Z794 Long term (current) use of insulin: Secondary | ICD-10-CM | POA: Diagnosis not present

## 2021-10-13 DIAGNOSIS — I10 Essential (primary) hypertension: Secondary | ICD-10-CM | POA: Diagnosis not present

## 2021-10-13 DIAGNOSIS — E1165 Type 2 diabetes mellitus with hyperglycemia: Secondary | ICD-10-CM | POA: Diagnosis not present

## 2021-10-13 DIAGNOSIS — E7800 Pure hypercholesterolemia, unspecified: Secondary | ICD-10-CM | POA: Diagnosis not present

## 2021-10-13 DIAGNOSIS — Z23 Encounter for immunization: Secondary | ICD-10-CM | POA: Diagnosis not present

## 2021-10-13 DIAGNOSIS — Z91199 Patient's noncompliance with other medical treatment and regimen due to unspecified reason: Secondary | ICD-10-CM | POA: Diagnosis not present

## 2022-02-09 DIAGNOSIS — Z0000 Encounter for general adult medical examination without abnormal findings: Secondary | ICD-10-CM | POA: Diagnosis not present

## 2022-02-09 DIAGNOSIS — Z6827 Body mass index (BMI) 27.0-27.9, adult: Secondary | ICD-10-CM | POA: Diagnosis not present

## 2022-02-09 DIAGNOSIS — Z794 Long term (current) use of insulin: Secondary | ICD-10-CM | POA: Diagnosis not present

## 2022-02-09 DIAGNOSIS — E1165 Type 2 diabetes mellitus with hyperglycemia: Secondary | ICD-10-CM | POA: Diagnosis not present

## 2022-02-09 DIAGNOSIS — I10 Essential (primary) hypertension: Secondary | ICD-10-CM | POA: Diagnosis not present

## 2022-02-09 DIAGNOSIS — E7800 Pure hypercholesterolemia, unspecified: Secondary | ICD-10-CM | POA: Diagnosis not present

## 2022-02-16 DIAGNOSIS — N400 Enlarged prostate without lower urinary tract symptoms: Secondary | ICD-10-CM | POA: Diagnosis not present

## 2022-02-16 DIAGNOSIS — E875 Hyperkalemia: Secondary | ICD-10-CM | POA: Diagnosis not present

## 2022-02-16 DIAGNOSIS — M47816 Spondylosis without myelopathy or radiculopathy, lumbar region: Secondary | ICD-10-CM | POA: Diagnosis not present

## 2022-02-16 DIAGNOSIS — K7689 Other specified diseases of liver: Secondary | ICD-10-CM | POA: Diagnosis not present

## 2022-02-16 DIAGNOSIS — I700 Atherosclerosis of aorta: Secondary | ICD-10-CM | POA: Diagnosis not present

## 2022-02-16 DIAGNOSIS — N179 Acute kidney failure, unspecified: Secondary | ICD-10-CM | POA: Diagnosis not present

## 2022-02-17 DIAGNOSIS — E1122 Type 2 diabetes mellitus with diabetic chronic kidney disease: Secondary | ICD-10-CM | POA: Diagnosis not present

## 2022-02-17 DIAGNOSIS — N189 Chronic kidney disease, unspecified: Secondary | ICD-10-CM | POA: Diagnosis not present

## 2022-02-17 DIAGNOSIS — I700 Atherosclerosis of aorta: Secondary | ICD-10-CM | POA: Diagnosis not present

## 2022-02-17 DIAGNOSIS — R338 Other retention of urine: Secondary | ICD-10-CM | POA: Diagnosis not present

## 2022-02-17 DIAGNOSIS — K7689 Other specified diseases of liver: Secondary | ICD-10-CM | POA: Diagnosis not present

## 2022-02-17 DIAGNOSIS — Z7982 Long term (current) use of aspirin: Secondary | ICD-10-CM | POA: Diagnosis not present

## 2022-02-17 DIAGNOSIS — N179 Acute kidney failure, unspecified: Secondary | ICD-10-CM | POA: Diagnosis not present

## 2022-02-17 DIAGNOSIS — N400 Enlarged prostate without lower urinary tract symptoms: Secondary | ICD-10-CM | POA: Diagnosis not present

## 2022-02-17 DIAGNOSIS — E875 Hyperkalemia: Secondary | ICD-10-CM | POA: Diagnosis not present

## 2022-02-17 DIAGNOSIS — Z79899 Other long term (current) drug therapy: Secondary | ICD-10-CM | POA: Diagnosis not present

## 2022-02-17 DIAGNOSIS — I129 Hypertensive chronic kidney disease with stage 1 through stage 4 chronic kidney disease, or unspecified chronic kidney disease: Secondary | ICD-10-CM | POA: Diagnosis not present

## 2022-02-17 DIAGNOSIS — M47816 Spondylosis without myelopathy or radiculopathy, lumbar region: Secondary | ICD-10-CM | POA: Diagnosis not present

## 2022-02-17 DIAGNOSIS — N401 Enlarged prostate with lower urinary tract symptoms: Secondary | ICD-10-CM | POA: Diagnosis not present

## 2022-02-20 DIAGNOSIS — M47816 Spondylosis without myelopathy or radiculopathy, lumbar region: Secondary | ICD-10-CM | POA: Diagnosis not present

## 2022-02-20 DIAGNOSIS — I10 Essential (primary) hypertension: Secondary | ICD-10-CM | POA: Diagnosis not present

## 2022-02-20 DIAGNOSIS — N179 Acute kidney failure, unspecified: Secondary | ICD-10-CM | POA: Diagnosis not present

## 2022-02-20 DIAGNOSIS — Z794 Long term (current) use of insulin: Secondary | ICD-10-CM | POA: Diagnosis not present

## 2022-02-20 DIAGNOSIS — N401 Enlarged prostate with lower urinary tract symptoms: Secondary | ICD-10-CM | POA: Diagnosis not present

## 2022-02-20 DIAGNOSIS — E7800 Pure hypercholesterolemia, unspecified: Secondary | ICD-10-CM | POA: Diagnosis not present

## 2022-02-20 DIAGNOSIS — Z955 Presence of coronary angioplasty implant and graft: Secondary | ICD-10-CM | POA: Diagnosis not present

## 2022-02-20 DIAGNOSIS — R338 Other retention of urine: Secondary | ICD-10-CM | POA: Diagnosis not present

## 2022-02-20 DIAGNOSIS — N400 Enlarged prostate without lower urinary tract symptoms: Secondary | ICD-10-CM | POA: Diagnosis not present

## 2022-02-20 DIAGNOSIS — Z7982 Long term (current) use of aspirin: Secondary | ICD-10-CM | POA: Diagnosis not present

## 2022-02-20 DIAGNOSIS — I700 Atherosclerosis of aorta: Secondary | ICD-10-CM | POA: Diagnosis not present

## 2022-02-20 DIAGNOSIS — Z466 Encounter for fitting and adjustment of urinary device: Secondary | ICD-10-CM | POA: Diagnosis not present

## 2022-02-20 DIAGNOSIS — E119 Type 2 diabetes mellitus without complications: Secondary | ICD-10-CM | POA: Diagnosis not present

## 2022-02-20 DIAGNOSIS — Z7984 Long term (current) use of oral hypoglycemic drugs: Secondary | ICD-10-CM | POA: Diagnosis not present

## 2022-02-20 DIAGNOSIS — E875 Hyperkalemia: Secondary | ICD-10-CM | POA: Diagnosis not present

## 2022-02-22 DIAGNOSIS — E1165 Type 2 diabetes mellitus with hyperglycemia: Secondary | ICD-10-CM | POA: Diagnosis not present

## 2022-02-22 DIAGNOSIS — Z794 Long term (current) use of insulin: Secondary | ICD-10-CM | POA: Diagnosis not present

## 2022-02-26 DIAGNOSIS — N189 Chronic kidney disease, unspecified: Secondary | ICD-10-CM | POA: Diagnosis not present

## 2022-02-26 DIAGNOSIS — I10 Essential (primary) hypertension: Secondary | ICD-10-CM | POA: Diagnosis not present

## 2022-02-26 DIAGNOSIS — E1165 Type 2 diabetes mellitus with hyperglycemia: Secondary | ICD-10-CM | POA: Diagnosis not present

## 2022-03-03 DIAGNOSIS — R972 Elevated prostate specific antigen [PSA]: Secondary | ICD-10-CM | POA: Diagnosis not present

## 2022-03-03 DIAGNOSIS — R338 Other retention of urine: Secondary | ICD-10-CM | POA: Diagnosis not present

## 2022-03-03 DIAGNOSIS — E119 Type 2 diabetes mellitus without complications: Secondary | ICD-10-CM | POA: Diagnosis not present

## 2022-03-03 DIAGNOSIS — N401 Enlarged prostate with lower urinary tract symptoms: Secondary | ICD-10-CM | POA: Diagnosis not present

## 2022-03-07 DIAGNOSIS — Z794 Long term (current) use of insulin: Secondary | ICD-10-CM | POA: Diagnosis not present

## 2022-03-07 DIAGNOSIS — E1165 Type 2 diabetes mellitus with hyperglycemia: Secondary | ICD-10-CM | POA: Diagnosis not present

## 2022-03-08 DIAGNOSIS — N401 Enlarged prostate with lower urinary tract symptoms: Secondary | ICD-10-CM | POA: Diagnosis not present

## 2022-03-08 DIAGNOSIS — R972 Elevated prostate specific antigen [PSA]: Secondary | ICD-10-CM | POA: Diagnosis not present

## 2022-03-08 DIAGNOSIS — R3912 Poor urinary stream: Secondary | ICD-10-CM | POA: Diagnosis not present

## 2022-03-14 DIAGNOSIS — R8271 Bacteriuria: Secondary | ICD-10-CM | POA: Diagnosis not present

## 2022-03-29 DIAGNOSIS — N189 Chronic kidney disease, unspecified: Secondary | ICD-10-CM | POA: Diagnosis not present

## 2022-03-29 DIAGNOSIS — N401 Enlarged prostate with lower urinary tract symptoms: Secondary | ICD-10-CM | POA: Diagnosis not present

## 2022-03-29 DIAGNOSIS — R3912 Poor urinary stream: Secondary | ICD-10-CM | POA: Diagnosis not present

## 2022-03-29 DIAGNOSIS — E1165 Type 2 diabetes mellitus with hyperglycemia: Secondary | ICD-10-CM | POA: Diagnosis not present

## 2022-03-29 DIAGNOSIS — I10 Essential (primary) hypertension: Secondary | ICD-10-CM | POA: Diagnosis not present

## 2022-03-29 DIAGNOSIS — R338 Other retention of urine: Secondary | ICD-10-CM | POA: Diagnosis not present

## 2022-04-28 DIAGNOSIS — E1165 Type 2 diabetes mellitus with hyperglycemia: Secondary | ICD-10-CM | POA: Diagnosis not present

## 2022-04-28 DIAGNOSIS — I10 Essential (primary) hypertension: Secondary | ICD-10-CM | POA: Diagnosis not present

## 2022-04-28 DIAGNOSIS — N189 Chronic kidney disease, unspecified: Secondary | ICD-10-CM | POA: Diagnosis not present

## 2022-05-24 DIAGNOSIS — Z794 Long term (current) use of insulin: Secondary | ICD-10-CM | POA: Diagnosis not present

## 2022-05-24 DIAGNOSIS — E1165 Type 2 diabetes mellitus with hyperglycemia: Secondary | ICD-10-CM | POA: Diagnosis not present

## 2022-06-29 DIAGNOSIS — N189 Chronic kidney disease, unspecified: Secondary | ICD-10-CM | POA: Diagnosis not present

## 2022-06-29 DIAGNOSIS — I10 Essential (primary) hypertension: Secondary | ICD-10-CM | POA: Diagnosis not present

## 2022-06-29 DIAGNOSIS — E1165 Type 2 diabetes mellitus with hyperglycemia: Secondary | ICD-10-CM | POA: Diagnosis not present

## 2022-07-26 DIAGNOSIS — R972 Elevated prostate specific antigen [PSA]: Secondary | ICD-10-CM | POA: Diagnosis not present

## 2022-08-02 DIAGNOSIS — R972 Elevated prostate specific antigen [PSA]: Secondary | ICD-10-CM | POA: Diagnosis not present

## 2022-08-02 DIAGNOSIS — R8271 Bacteriuria: Secondary | ICD-10-CM | POA: Diagnosis not present

## 2022-08-02 DIAGNOSIS — N401 Enlarged prostate with lower urinary tract symptoms: Secondary | ICD-10-CM | POA: Diagnosis not present

## 2022-08-02 DIAGNOSIS — R3912 Poor urinary stream: Secondary | ICD-10-CM | POA: Diagnosis not present

## 2022-08-15 DIAGNOSIS — Z1331 Encounter for screening for depression: Secondary | ICD-10-CM | POA: Diagnosis not present

## 2022-08-15 DIAGNOSIS — E1122 Type 2 diabetes mellitus with diabetic chronic kidney disease: Secondary | ICD-10-CM | POA: Diagnosis not present

## 2022-08-15 DIAGNOSIS — E7800 Pure hypercholesterolemia, unspecified: Secondary | ICD-10-CM | POA: Diagnosis not present

## 2022-08-15 DIAGNOSIS — Z6827 Body mass index (BMI) 27.0-27.9, adult: Secondary | ICD-10-CM | POA: Diagnosis not present

## 2022-08-15 DIAGNOSIS — N189 Chronic kidney disease, unspecified: Secondary | ICD-10-CM | POA: Diagnosis not present

## 2022-08-15 DIAGNOSIS — Z9181 History of falling: Secondary | ICD-10-CM | POA: Diagnosis not present

## 2022-08-15 DIAGNOSIS — Z23 Encounter for immunization: Secondary | ICD-10-CM | POA: Diagnosis not present

## 2022-08-17 DIAGNOSIS — N184 Chronic kidney disease, stage 4 (severe): Secondary | ICD-10-CM | POA: Diagnosis not present

## 2022-08-17 DIAGNOSIS — Z6826 Body mass index (BMI) 26.0-26.9, adult: Secondary | ICD-10-CM | POA: Diagnosis not present

## 2022-08-17 DIAGNOSIS — I259 Chronic ischemic heart disease, unspecified: Secondary | ICD-10-CM | POA: Diagnosis not present

## 2022-08-17 DIAGNOSIS — R0989 Other specified symptoms and signs involving the circulatory and respiratory systems: Secondary | ICD-10-CM | POA: Diagnosis not present

## 2022-08-23 ENCOUNTER — Encounter: Payer: Self-pay | Admitting: Cardiology

## 2022-08-25 DIAGNOSIS — E669 Obesity, unspecified: Secondary | ICD-10-CM | POA: Insufficient documentation

## 2022-08-25 DIAGNOSIS — I10 Essential (primary) hypertension: Secondary | ICD-10-CM | POA: Insufficient documentation

## 2022-08-25 DIAGNOSIS — B359 Dermatophytosis, unspecified: Secondary | ICD-10-CM | POA: Insufficient documentation

## 2022-08-25 DIAGNOSIS — E1165 Type 2 diabetes mellitus with hyperglycemia: Secondary | ICD-10-CM | POA: Insufficient documentation

## 2022-08-25 DIAGNOSIS — E782 Mixed hyperlipidemia: Secondary | ICD-10-CM | POA: Insufficient documentation

## 2022-08-25 DIAGNOSIS — I259 Chronic ischemic heart disease, unspecified: Secondary | ICD-10-CM | POA: Insufficient documentation

## 2022-08-25 DIAGNOSIS — N189 Chronic kidney disease, unspecified: Secondary | ICD-10-CM | POA: Insufficient documentation

## 2022-08-25 DIAGNOSIS — D510 Vitamin B12 deficiency anemia due to intrinsic factor deficiency: Secondary | ICD-10-CM | POA: Insufficient documentation

## 2022-08-25 DIAGNOSIS — I129 Hypertensive chronic kidney disease with stage 1 through stage 4 chronic kidney disease, or unspecified chronic kidney disease: Secondary | ICD-10-CM

## 2022-08-25 DIAGNOSIS — R972 Elevated prostate specific antigen [PSA]: Secondary | ICD-10-CM | POA: Insufficient documentation

## 2022-08-25 DIAGNOSIS — E1129 Type 2 diabetes mellitus with other diabetic kidney complication: Secondary | ICD-10-CM | POA: Insufficient documentation

## 2022-08-25 HISTORY — DX: Hypertensive chronic kidney disease with stage 1 through stage 4 chronic kidney disease, or unspecified chronic kidney disease: I12.9

## 2022-08-27 NOTE — Progress Notes (Unsigned)
Cardiology Office Note:    Date:  08/28/2022   ID:  Brady [name], DOB 02-07-1946, MRN [id number]  PCP:  Brady Sheriff, MD  Cardiologist:  Brady More, MD   Referring MD: No ref. provider found  ASSESSMENT:    1. Nonrheumatic aortic valve stenosis   2. Coronary artery disease of native artery of native heart with stable angina pectoris ([place])   3. Combined hyperlipidemia   4. 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 He has a history of remote panel balloon angioplasty 1989 as stage IV CKD and for further evaluation of a myocardial perfusion study looking to avoid contrast dye. Continues current lipid-lowering LDL is at target Hyper tension seems to be poorly controlled awaiting the results of his echocardiogram I am not going to change treatment he will continue his amlodipine benazepril clonidine avoid diuretics avoid beta-blockers with his resting bradycardia and avoid hydralazine if he has severe aortic stenosis we will continue to record and trend his home blood pressure  Next appointment 6 weeks   Medication Adjustments/Labs and Tests Ordered: Current medicines are reviewed at length with the patient today.  Concerns regarding medicines are outlined above.  Orders Placed This Encounter  Procedures   MYOCARDIAL PERFUSION IMAGING   ECHOCARDIOGRAM COMPLETE   No orders of the defined types were placed in this encounter.   Chief complaint I find myself fatigued my blood pressures been elevated  History of Present Illness:    Brady [name] is a 76 y.o. male who is being seen today for the evaluation of heart murmur at the request of patient Records from his family physician relates that he has a past history of remote  angioplasty in 1989 and a coronary angiogram in 2008 with nonobstructive less than 50% stenosis.  Recent labs 02/02/2022 A1c 9.5% 05/24/2022 6.7% Hemoglobin 13.0 platelets 187,000 Creatinine 2.3 GFR 28 cc/min stage IV CKD potassium 5.3 cholesterol 126 LDL 68 An EKG performed 08/16/2022 showing sinus rhythm low voltage in the frontal leads occasional APCs otherwise normal personally reviewed  He relates that recently has not felt well he finds himself fatigued he sought medical attention with his PCP. He walks 15 minutes 3 times a day At times he has tightness in his chest with and without activity its not limiting he continues to walk when it occurs with activity and it resolves spontaneously.  It occurs daily or several times a day.  It is not severe but is another reason he went to see his PCP He checks his home blood pressure and he gets numbers greater than 1 54-6 60 systolic with good technique and a validated device He is not having edema orthopnea palpitation or syncope He tells me he was told a year ago that he had a heart murmur  Past Medical History:  Diagnosis Date   Chronic ischemic heart disease    Chronic kidney disease    Combined hyperlipidemia    Dermatophytoses    nail, failed terbinafine rx   Diabetes mellitus with renal complications ([facility])    Diabetes mellitus without complication ([facility])    Elevated PSA    with prostatic hypertrophy followed by Dr. Alinda [name]   Essential hypertension    Hypertension    Obese  Pernicious anemia    Uncontrolled type 2 diabetes mellitus with hyperglycemia, with long-term current use of insulin [facility])     Past Surgical History:  Procedure Laterality Date   ANGIOPLASTY  1989   non-obstructive COR. Artery DZ (<50% obstruction)   CARDIAC CATHETERIZATION Bilateral 2008    Current Medications: Current Meds  Medication Sig   amLODipine (NORVASC) 10 MG tablet Take 10 mg by mouth daily.   aspirin EC 81 MG tablet Take 81 mg by mouth  daily. Swallow whole.   benazepril (LOTENSIN) 40 MG tablet Take 40 mg by mouth daily.   cloNIDine (CATAPRES) 0.2 MG tablet Take 0.2 mg by mouth 2 (two) times daily.   cyanocobalamin (VITAMIN B12) 1000 MCG tablet Take 2,000 mcg by mouth daily.   finasteride (PROSCAR) 5 MG tablet Take 5 mg by mouth daily.   Insulin Glargine (BASAGLAR KWIKPEN) 100 UNIT/ML Inject 8 Units into the skin in the morning.   insulin lispro (HUMALOG KWIKPEN) 100 UNIT/ML KwikPen Inject 6 Units into the skin in the morning. 8 units prior to lunch and 4 units prior to evening meal   pravastatin (PRAVACHOL) 10 MG tablet Take 10 mg by mouth at bedtime.   tamsulosin (FLOMAX) 0.4 MG CAPS capsule Take 0.4 mg by mouth 2 (two) times daily.     Allergies:   Actos [pioglitazone], Atenolol, Invokana [canagliflozin], Metformin and related, Motrin [ibuprofen], and Victoza [liraglutide]   Social History   Socioeconomic History   Marital status: Married    Spouse name: Not on file   Number of children: Not on file   Years of education: Not on file   Highest education level: Not on file  Occupational History   Not on file  Tobacco Use   Smoking status: Former    Types: Cigarettes    Quit date: 1989    Years since quitting: 34.8    Passive exposure: Past   Smokeless tobacco: Never  Vaping Use   Vaping Use: Never used  Substance and Sexual Activity   Alcohol use: Never   Drug use: Never   Sexual activity: Not on file  Other Topics Concern   Not on file  Social History Narrative   Not on file   Social Determinants of Health   Financial Resource Strain: Not on file  Food Insecurity: Not on file  Transportation Needs: Not on file  Physical Activity: Not on file  Stress: Not on file  Social Connections: Not on file     Family History: The patient's family history includes Emphysema in his father; Hypertension in his sister; Stomach cancer in his mother; Thyroid cancer in his brother.  ROS:   ROS Please see the  history of present illness.     All other systems reviewed and are negative.  EKGs/Labs/Other Studies Reviewed:    The following studies were reviewed today:   EKG:  EKG is  ordered today.  The ekg ordered today is personally reviewed and demonstrates sinus bradycardia 53 bpm otherwise normal.  I repeated the EKG because he had vague chest discomfort in the office    Physical Exam:    VS:  BP (!) 172/62 (BP Location: Left Arm, Patient Position: Sitting, Cuff Size: Normal)   Pulse 62   Ht 5\' 4"  (1.626 m)   Wt 155 lb (70.3 kg)   SpO2 98%   BMI 26.61 kg/m     Wt Readings from Last 3 Encounters:  08/28/22 155 lb (70.3 kg)  08/17/22 153 lb (69.4  kg)  11/17/18 160 lb (72.6 kg)     GEN: Appears his age well nourished, well developed in no acute distress HEENT: Normal NECK: No JVD; No carotid bruits LYMPHATICS: No lymphadenopathy CARDIAC: 2/6 to 3/6 murmur of AS almost holosystolic radiates up to the base of the carotids and encompasses S2 RRR,  RESPIRATORY:  Clear to auscultation without rales, wheezing or rhonchi  ABDOMEN: Soft, non-tender, non-distended MUSCULOSKELETAL:  No edema; No deformity  SKIN: Warm and dry NEUROLOGIC:  Alert and oriented x 3 PSYCHIATRIC:  Normal affect     Signed, Brady More, MD  08/28/2022 11:05 AM    [facility]

## 2022-08-28 ENCOUNTER — Encounter: Payer: Self-pay | Admitting: Cardiology

## 2022-08-28 ENCOUNTER — Ambulatory Visit: Payer: PPO | Attending: Cardiology | Admitting: Cardiology

## 2022-08-28 VITALS — BP 172/62 | HR 62 | Ht 64.0 in | Wt 155.0 lb

## 2022-08-28 DIAGNOSIS — I129 Hypertensive chronic kidney disease with stage 1 through stage 4 chronic kidney disease, or unspecified chronic kidney disease: Secondary | ICD-10-CM | POA: Diagnosis not present

## 2022-08-28 DIAGNOSIS — N184 Chronic kidney disease, stage 4 (severe): Secondary | ICD-10-CM

## 2022-08-28 DIAGNOSIS — I350 Nonrheumatic aortic (valve) stenosis: Secondary | ICD-10-CM

## 2022-08-28 DIAGNOSIS — E782 Mixed hyperlipidemia: Secondary | ICD-10-CM

## 2022-08-28 DIAGNOSIS — I25118 Atherosclerotic heart disease of native coronary artery with other forms of angina pectoris: Secondary | ICD-10-CM | POA: Diagnosis not present

## 2022-08-28 NOTE — Addendum Note (Signed)
Addended by: Jerl Santos R on: 08/28/2022 02:31 PM   Modules accepted: Orders

## 2022-08-28 NOTE — Patient Instructions (Signed)
Medication Instructions:  Your physician recommends that you continue on your current medications as directed. Please refer to the Current Medication list given to you today.  *If you need a refill on your cardiac medications before your next appointment, please call your pharmacy*   Lab Work: NONE If you have labs (blood work) drawn today and your tests are completely normal, you will receive your results only by: [place] (if you have MyChart) OR A paper copy in the mail If you have any lab test that is abnormal or we need to change your treatment, we will call you to review the results.   Testing/Procedures: Your physician has requested that you have an echocardiogram. Echocardiography is a painless test that uses sound waves to create images of your heart. It provides your doctor with information about the size and shape of your heart and how well your heart's chambers and valves are working. This procedure takes approximately one hour. There are no restrictions for this procedure. Please do NOT wear cologne, perfume, aftershave, or lotions (deodorant is allowed). Please arrive 15 minutes prior to your appointment time.   Your physician has requested that you have a lexiscan myoview. For further information please visit [email or web address]. Please follow instruction sheet, as given.   The test will take approximately 3 to 4 hours to complete; you may bring reading material.  If someone comes with you to your appointment, they will need to remain in the main lobby due to limited space in the testing area. **If you are pregnant or breastfeeding, please notify the nuclear lab prior to your appointment**  How to prepare for your Myocardial Perfusion Test:             Hold all erectile dysfunction medication 48 hours prior to test Do not eat or drink 3 hours prior to your test, except you may have water. Do not consume products containing caffeine (regular or decaffeinated) 12 hours  prior to your test. (ex: coffee, chocolate, sodas, tea). Do bring a list of your current medications with you.  If not listed below, you may take your medications as normal. Do wear comfortable clothes (no dresses or overalls) and walking shoes, tennis shoes preferred (No heels or open toe shoes are allowed). Do NOT wear cologne, perfume, aftershave, or lotions (deodorant is allowed). If these instructions are not followed, your test will have to be rescheduled.    Follow-Up: At [facility], you and your health needs are our priority.  As part of our continuing mission to provide you with exceptional heart care, we have created designated Provider Care Teams.  These Care Teams include your primary Cardiologist (physician) and Advanced Practice Providers (APPs -  Physician Assistants and Nurse Practitioners) who all work together to provide you with the care you need, when you need it.  We recommend signing up for the patient portal called "MyChart".  Sign up information is provided on this After Visit Summary.  MyChart is used to connect with patients for Virtual Visits (Telemedicine).  Patients are able to view lab/test results, encounter notes, upcoming appointments, etc.  Non-urgent messages can be sent to your provider as well.   To learn more about what you can do with MyChart, go to [email or web address].    Your next appointment:   6 week(s)  The format for your next appointment:   In Person  Provider:   Shirlee More, MD    Other Instructions   Important Information About Sugar

## 2022-09-05 ENCOUNTER — Telehealth: Payer: Self-pay | Admitting: *Deleted

## 2022-09-05 DIAGNOSIS — E1165 Type 2 diabetes mellitus with hyperglycemia: Secondary | ICD-10-CM | POA: Diagnosis not present

## 2022-09-05 DIAGNOSIS — Z794 Long term (current) use of insulin: Secondary | ICD-10-CM | POA: Diagnosis not present

## 2022-09-05 NOTE — Telephone Encounter (Signed)
Patient given detailed instructions per Myocardial Perfusion Study Information Sheet for the test on 09/12/22 at 0815. Patient notified to arrive 15 minutes early and that it is imperative to arrive on time for appointment to keep from having the test rescheduled.  If you need to cancel or reschedule your appointment, please call the office within 24 hours of your appointment. . Patient verbalized understanding.Brady [name], [name]

## 2022-09-12 ENCOUNTER — Ambulatory Visit (INDEPENDENT_AMBULATORY_CARE_PROVIDER_SITE_OTHER): Payer: PPO

## 2022-09-12 ENCOUNTER — Ambulatory Visit: Payer: PPO | Attending: Cardiology

## 2022-09-12 DIAGNOSIS — E782 Mixed hyperlipidemia: Secondary | ICD-10-CM

## 2022-09-12 DIAGNOSIS — I350 Nonrheumatic aortic (valve) stenosis: Secondary | ICD-10-CM | POA: Diagnosis not present

## 2022-09-12 DIAGNOSIS — I25118 Atherosclerotic heart disease of native coronary artery with other forms of angina pectoris: Secondary | ICD-10-CM

## 2022-09-12 LAB — ECHOCARDIOGRAM COMPLETE
AR max vel: 2.24 cm2
AV Area VTI: 2.26 cm2
AV Area mean vel: 2.23 cm2
AV Mean grad: 12.3 mmHg
AV Peak grad: 20.2 mmHg
Ao pk vel: 2.25 m/s
Area-P 1/2: 7.16 cm2
Height: 64 in
MV M vel: 6.11 m/s
MV Peak grad: 149.3 mmHg
S' Lateral: 3.9 cm
Weight: 2480 oz

## 2022-09-12 LAB — MYOCARDIAL PERFUSION IMAGING: Rest HR: 63 {beats}/min

## 2022-09-12 MED ORDER — TECHNETIUM TC 99M TETROFOSMIN IV KIT
10.1000 | PACK | Freq: Once | INTRAVENOUS | Status: AC | PRN
  Administered 2022-09-12: 10.1 via INTRAVENOUS

## 2022-09-12 MED ORDER — REGADENOSON 0.4 MG/5ML IV SOLN
0.4000 mg | Freq: Once | INTRAVENOUS | Status: AC
  Administered 2022-09-12: 0.4 mg via INTRAVENOUS

## 2022-09-12 MED ORDER — TECHNETIUM TC 99M TETROFOSMIN IV KIT
29.6000 | PACK | Freq: Once | INTRAVENOUS | Status: AC | PRN
  Administered 2022-09-12: 29.6 via INTRAVENOUS

## 2022-09-13 LAB — MYOCARDIAL PERFUSION IMAGING
LV dias vol: 125 mL (ref 62–150)
LV sys vol: 42 mL
Nuc Stress EF: 66 %
Peak HR: 74 {beats}/min
Rest Nuclear Isotope Dose: 10.1 mCi
SDS: 4
SRS: 3
SSS: 7
ST Depression (mm): 0 mm
Stress Nuclear Isotope Dose: 29.6 mCi
TID: 1.01

## 2022-09-14 ENCOUNTER — Telehealth: Payer: Self-pay | Admitting: Cardiology

## 2022-09-14 NOTE — Telephone Encounter (Signed)
Patient was returning call. Please advise ?

## 2022-09-14 NOTE — Telephone Encounter (Signed)
Pt is returning call. Transferred to Truddie Hidden, RN.

## 2022-09-14 NOTE — Telephone Encounter (Signed)
Results reviewed with pt as per Dr. Munley's note.  Pt verbalized understanding and had no additional questions. Routed to PCP  

## 2022-09-15 NOTE — Telephone Encounter (Signed)
Results reviewed with pt as per Dr. Munley's note.  Pt verbalized understanding and had no additional questions. Routed to PCP  

## 2022-09-28 DIAGNOSIS — E1165 Type 2 diabetes mellitus with hyperglycemia: Secondary | ICD-10-CM | POA: Diagnosis not present

## 2022-09-28 DIAGNOSIS — N189 Chronic kidney disease, unspecified: Secondary | ICD-10-CM | POA: Diagnosis not present

## 2022-09-28 DIAGNOSIS — I10 Essential (primary) hypertension: Secondary | ICD-10-CM | POA: Diagnosis not present

## 2022-10-12 DIAGNOSIS — I259 Chronic ischemic heart disease, unspecified: Secondary | ICD-10-CM | POA: Diagnosis not present

## 2022-10-12 DIAGNOSIS — I10 Essential (primary) hypertension: Secondary | ICD-10-CM | POA: Diagnosis not present

## 2022-10-12 DIAGNOSIS — N189 Chronic kidney disease, unspecified: Secondary | ICD-10-CM | POA: Diagnosis not present

## 2022-10-12 DIAGNOSIS — Z6826 Body mass index (BMI) 26.0-26.9, adult: Secondary | ICD-10-CM | POA: Diagnosis not present

## 2022-10-16 DIAGNOSIS — I10 Essential (primary) hypertension: Secondary | ICD-10-CM | POA: Insufficient documentation

## 2022-10-16 NOTE — Progress Notes (Signed)
Cardiology Office Note:    Date:  10/17/2022   ID:  Shalik R [name], DOB [birth year], MRN [id number]  PCP:  Angelina Sheriff, MD  Cardiologist:  Shirlee More, MD    Referring MD: Angelina Sheriff, MD    ASSESSMENT:    1. Coronary artery disease of native artery of native heart with stable angina pectoris ([place])   2. Aortic valve calcification   3. 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 Combined hyperlipidemia    PLAN:    In order of problems listed above:  He has stable exertional angina and oral mononitrate normal perfusion study at this time would not advise coronary angiography or further revascularization, continue aspirin amlodipine and statin Reassured does not require AVR he has no stenosis Controlled continue his multidrug regimen including clonidine Continue his statin   Next appointment: 6 months   Medication Adjustments/Labs and Tests Ordered: Current medicines are reviewed at length with the patient today.  Concerns regarding medicines are outlined above.  No orders of the defined types were placed in this encounter.  No orders of the defined types were placed in this encounter.   Chief Complaint  Patient presents with   Follow-up   Coronary Artery Disease    History of Present Illness:    Brady [name] is a 76 y.o. male with a hx of ADD with remote angioplasty in 1989 hypertensive kidney disease stage IV CKD hyperlipidemia and aortic stenosis last seen 08/23/2022.   An echocardiogram 09/12/2022 showed the aortic valve to be mildly calcified without regurgitation or stenosis.  Left ventricular function was normal EF 60 to 65%.  Both the left and right atrium are mildly enlarged and there is mild mitral regurgitation.  1. Left ventricular ejection fraction, by estimation, is 60 to 65%. Left  ventricular ejection fraction by 3D volume is 68 %. The left ventricle has  normal function. The left ventricle has no  regional wall motion  abnormalities. Left ventricular diastolic   parameters are consistent with Grade I diastolic dysfunction (impaired  relaxation). The average left ventricular global longitudinal strain is  -20.9 %. The global longitudinal strain is normal.   2. Right ventricular systolic function is normal. The right ventricular  size is normal.   3. Left atrial size was mild to moderately dilated.   4. Right atrial size was mild to moderately dilated.   5. The mitral valve is normal in structure. Mild mitral valve  regurgitation. No evidence of mitral stenosis.   6. The aortic valve is normal in structure. There is mild calcification  of the aortic valve. Aortic valve regurgitation is not visualized. Aortic  valve sclerosis/calcification is present, without any evidence of aortic  stenosis.  With his CKD underwent a myocardial perfusion study 09/12/2022 normal left ventricular ejection fraction 66% study was low risk normal perfusion without ischemia.  Compliance with diet, lifestyle and medications: Yes  He arrived very anxious to the office was under the impression he needed valve replacement I reviewed his studies his aortic valve calcification but no stenosis. We did a perfusion study with his stage IV CKD which showed no findings of ischemia still having exertional angina number and optimize medical therapy by having a little mononitrate I do not think he needs referral to coronary angiography No edema shortness of breath at rest angina change in his exertional anginal discomfort walking outdoors palpitation or syncope He exercises 45 minutes a day and indoors  Past Medical History:  Diagnosis Date   Chronic ischemic heart disease    Chronic kidney disease    Combined hyperlipidemia    Dermatophytoses    nail, failed terbinafine rx   Diabetes mellitus with renal complications ([place])    Diabetes mellitus without complication (HCC)    Elevated PSA    with prostatic  hypertrophy followed by Dr. Alinda Money   Essential hypertension    Hypertension    Obese    Pernicious anemia    Uncontrolled type 2 diabetes mellitus with hyperglycemia, with long-term current use of insulin [facility])     Past Surgical History:  Procedure Laterality Date   ANGIOPLASTY  1989   non-obstructive COR. Artery DZ (<50% obstruction)   CARDIAC CATHETERIZATION Bilateral 2008    Current Medications: Current Meds  Medication Sig   cloNIDine (CATAPRES) 0.3 MG tablet Take 0.3 mg by mouth 2 (two) times daily.     Allergies:   Actos [pioglitazone], Atenolol, Invokana [canagliflozin], Metformin and related, Motrin [ibuprofen], and Victoza [liraglutide]   Social History   Socioeconomic History   Marital status: Married    Spouse name: Not on file   Number of children: Not on file   Years of education: Not on file   Highest education level: Not on file  Occupational History   Not on file  Tobacco Use   Smoking status: Former    Types: Cigarettes    Quit date: 1989    Years since quitting: 34.9    Passive exposure: Past   Smokeless tobacco: Never  Vaping Use   Vaping Use: Never used  Substance and Sexual Activity   Alcohol use: Never   Drug use: Never   Sexual activity: Not on file  Other Topics Concern   Not on file  Social History Narrative   Not on file   Social Determinants of Health   Financial Resource Strain: Not on file  Food Insecurity: Not on file  Transportation Needs: Not on file  Physical Activity: Not on file  Stress: Not on file  Social Connections: Not on file     Family History: The patient's family history includes Emphysema in his father; Hypertension in his sister; Stomach cancer in his mother; Thyroid cancer in his brother. ROS:   Please see the history of present illness.    All other systems reviewed and are negative.  EKGs/Labs/Other Studies Reviewed:    The following studies were reviewed today:  Recent labs 02/02/2022 A1c 9.5%  05/24/2022 6.7% Hemoglobin 13.0 platelets 187,000 Creatinine 2.3 GFR 28 cc/min stage IV CKD potassium 5.3 cholesterol 126 LDL 68  Physical Exam:    VS:  BP 124/60 (BP Location: Left Arm, Patient Position: Sitting, Cuff Size: Normal)   Pulse (!) 52   Ht 5\' 4"  (1.626 m)   Wt 166 lb (75.3 kg)   SpO2 99%   BMI 28.49 kg/m     Wt Readings from Last 3 Encounters:  10/17/22 166 lb (75.3 kg)  09/12/22 155 lb (70.3 kg)  08/28/22 155 lb (70.3 kg)     GEN:  Well nourished, well developed in no acute distress HEENT: Normal NECK: No JVD; No carotid bruits LYMPHATICS: No lymphadenopathy CARDIAC: Grade 1 through 2/6 systolic ejection murmur radiates to the carotids or carotid bruits RRR, no murmurs, rubs, gallops RESPIRATORY:  Clear to auscultation without rales, wheezing or rhonchi  ABDOMEN: Soft, non-tender, non-distended MUSCULOSKELETAL:  No edema; No deformity  SKIN: Warm and dry NEUROLOGIC:  Alert and oriented x  3 PSYCHIATRIC:  Normal affect    Signed, Shirlee More, MD  10/17/2022 9:29 AM    [facility]

## 2022-10-17 ENCOUNTER — Encounter: Payer: Self-pay | Admitting: Cardiology

## 2022-10-17 ENCOUNTER — Ambulatory Visit: Payer: PPO | Attending: Cardiology | Admitting: Cardiology

## 2022-10-17 VITALS — BP 124/60 | HR 52 | Ht 64.0 in | Wt 166.0 lb

## 2022-10-17 DIAGNOSIS — I359 Nonrheumatic aortic valve disorder, unspecified: Secondary | ICD-10-CM

## 2022-10-17 DIAGNOSIS — E782 Mixed hyperlipidemia: Secondary | ICD-10-CM

## 2022-10-17 DIAGNOSIS — I25118 Atherosclerotic heart disease of native coronary artery with other forms of angina pectoris: Secondary | ICD-10-CM | POA: Diagnosis not present

## 2022-10-17 DIAGNOSIS — N184 Chronic kidney disease, stage 4 (severe): Secondary | ICD-10-CM | POA: Diagnosis not present

## 2022-10-17 DIAGNOSIS — I129 Hypertensive chronic kidney disease with stage 1 through stage 4 chronic kidney disease, or unspecified chronic kidney disease: Secondary | ICD-10-CM | POA: Diagnosis not present

## 2022-10-17 MED ORDER — ISOSORBIDE MONONITRATE ER 30 MG PO TB24: 30.0000 mg | ORAL_TABLET | Freq: Every day | ORAL | 3 refills | Status: DC

## 2022-10-17 MED ORDER — NITROGLYCERIN 0.4 MG SL SUBL: 0.4000 mg | SUBLINGUAL_TABLET | SUBLINGUAL | 3 refills | Status: DC | PRN

## 2022-10-17 NOTE — Patient Instructions (Signed)
Medication Instructions: Your physician has recommended you make the following change in your medication:  Start Imdur 30 mg once daily Rx for nitroglycerine sent to pharmacy Nitroglycerin 0.4 mg sublingual (under your tongue) as needed for chest pain. If experiencing chest pain, stop what you are doing and sit down. Take 1 nitroglycerin and wait 5 minutes. If chest pain continues, take another nitroglycerin and wait 5 minutes. If chest pain does not subside, take 1 more nitroglycerin and dial 911. You make take a total of 3 nitroglycerin in a 15 minute time frame.    *If you need a refill on your cardiac medications before your next appointment, please call your pharmacy*   Lab Work: NONE If you have labs (blood work) drawn today and your tests are completely normal, you will receive your results only by: [place] (if you have MyChart) OR A paper copy in the mail If you have any lab test that is abnormal or we need to change your treatment, we will call you to review the results.   Testing/Procedures: NONE   Follow-Up: At [facility], you and your health needs are our priority.  As part of our continuing mission to provide you with exceptional heart care, we have created designated Provider Care Teams.  These Care Teams include your primary Cardiologist (physician) and Advanced Practice Providers (APPs -  Physician Assistants and Nurse Practitioners) who all work together to provide you with the care you need, when you need it.  We recommend signing up for the patient portal called "MyChart".  Sign up information is provided on this After Visit Summary.  MyChart is used to connect with patients for Virtual Visits (Telemedicine).  Patients are able to view lab/test results, encounter notes, upcoming appointments, etc.  Non-urgent messages can be sent to your provider as well.   To learn more about what you can do with MyChart, go to [email or web address].    Your next  appointment:   6 month(s)  The format for your next appointment:   In Person  Provider:   Shirlee More, MD    Other Instructions   Important Information About Sugar

## 2022-10-29 DIAGNOSIS — E1165 Type 2 diabetes mellitus with hyperglycemia: Secondary | ICD-10-CM | POA: Diagnosis not present

## 2022-10-29 DIAGNOSIS — I10 Essential (primary) hypertension: Secondary | ICD-10-CM | POA: Diagnosis not present

## 2022-10-29 DIAGNOSIS — N189 Chronic kidney disease, unspecified: Secondary | ICD-10-CM | POA: Diagnosis not present

## 2023-01-09 DIAGNOSIS — E7800 Pure hypercholesterolemia, unspecified: Secondary | ICD-10-CM | POA: Diagnosis not present

## 2023-01-09 DIAGNOSIS — E1129 Type 2 diabetes mellitus with other diabetic kidney complication: Secondary | ICD-10-CM | POA: Diagnosis not present

## 2023-01-12 DIAGNOSIS — N189 Chronic kidney disease, unspecified: Secondary | ICD-10-CM | POA: Diagnosis not present

## 2023-02-27 DIAGNOSIS — I10 Essential (primary) hypertension: Secondary | ICD-10-CM | POA: Diagnosis not present

## 2023-02-27 DIAGNOSIS — N189 Chronic kidney disease, unspecified: Secondary | ICD-10-CM | POA: Diagnosis not present

## 2023-02-27 DIAGNOSIS — E1129 Type 2 diabetes mellitus with other diabetic kidney complication: Secondary | ICD-10-CM | POA: Diagnosis not present

## 2023-03-08 DIAGNOSIS — N189 Chronic kidney disease, unspecified: Secondary | ICD-10-CM | POA: Diagnosis not present

## 2023-03-30 DIAGNOSIS — N189 Chronic kidney disease, unspecified: Secondary | ICD-10-CM | POA: Diagnosis not present

## 2023-03-30 DIAGNOSIS — E1129 Type 2 diabetes mellitus with other diabetic kidney complication: Secondary | ICD-10-CM | POA: Diagnosis not present

## 2023-03-30 DIAGNOSIS — I10 Essential (primary) hypertension: Secondary | ICD-10-CM | POA: Diagnosis not present

## 2023-04-10 DIAGNOSIS — N189 Chronic kidney disease, unspecified: Secondary | ICD-10-CM | POA: Diagnosis not present

## 2023-04-12 DIAGNOSIS — E875 Hyperkalemia: Secondary | ICD-10-CM | POA: Diagnosis not present

## 2023-04-22 NOTE — Progress Notes (Unsigned)
Cardiology Office Note:    Date:  04/23/2023   ID:  Brady [name], DOB 1946/07/25, MRN [id number]  PCP:  Noni Saupe, MD  Cardiologist:  Norman Herrlich, MD    Referring MD: Noni Saupe, MD    ASSESSMENT:    1. Coronary artery disease of native artery of native heart with stable angina pectoris (HCC)   2. Aortic valve calcification   3. 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 Combined hyperlipidemia    PLAN:    In order of problems listed above:  Brady [name] continues to do well with CAD New York Heart Association class I having no anginal discomfort continue his medical therapy including aspirin statin oral nitrate and calcium channel blocker.  At this time I do not think he requires an ischemia evaluation Stable he has a benign ejection murmur no stenosis Well-controlled hypertension continue his current medical regimen labs are followed in his PCP office Continue his current statin   Next appointment: 6 months   Medication Adjustments/Labs and Tests Ordered: Current medicines are reviewed at length with the patient today.  Concerns regarding medicines are outlined above.  No orders of the defined types were placed in this encounter.  No orders of the defined types were placed in this encounter.    History of Present Illness:    Brady [name] is a 77 y.o. male with a hx of CAD with remote angioplasty in 1989 hypertensive kidney disease stage IV hyperlipidemia and aortic stenosis although his last echocardiogram in November 2023 showed no stenosis although the valve was mildly calcified.  He was last seen 10/17/2022.  Compliance with diet, lifestyle and medications: Yes  He remains quite active he walks about 60 minutes a day and has had no cardiovascular symptoms of exercise intolerance claudication shortness of breath edema chest pain palpitation or syncope He tolerates his statin without muscle pain or weakness his  last lipid profile 01/09/2023 cholesterol 129 triglycerides 61 HDL 54 A1c 7.1% Past Medical History:  Diagnosis Date   Chronic ischemic heart disease    Chronic kidney disease    Combined hyperlipidemia    Dermatophytoses    nail, failed terbinafine rx   Diabetes mellitus with renal complications (HCC)    Diabetes mellitus without complication (HCC)    Elevated PSA    with prostatic hypertrophy followed by Dr. Laverle Patter   Essential hypertension    Hypertension    Obese    Pernicious anemia    Uncontrolled type 2 diabetes mellitus with hyperglycemia, with long-term current use of insulin [facility])     Past Surgical History:  Procedure Laterality Date   ANGIOPLASTY  1989   non-obstructive COR. Artery DZ (<50% obstruction)   CARDIAC CATHETERIZATION Bilateral 2008    Current Medications: Current Meds  Medication Sig   amLODipine (NORVASC) 10 MG tablet Take 10 mg by mouth daily.   aspirin EC 81 MG tablet Take 81 mg by mouth daily. Swallow whole.   benazepril (LOTENSIN) 40 MG tablet Take 40 mg by mouth daily.   cloNIDine (CATAPRES) 0.3 MG tablet Take 0.3 mg by mouth 2 (two) times daily.   cyanocobalamin (VITAMIN B12) 1000 MCG tablet Take 2,000 mcg by mouth daily.   finasteride (PROSCAR) 5 MG tablet Take 5 mg by mouth daily.   Insulin Glargine (BASAGLAR KWIKPEN) 100 UNIT/ML Inject 8 Units into the skin in the morning.   insulin lispro (HUMALOG KWIKPEN) 100 UNIT/ML KwikPen Inject 6  Units into the skin in the morning. 8 units prior to lunch and 4 units prior to evening meal   isosorbide mononitrate (IMDUR) 30 MG 24 hr tablet Take 1 tablet (30 mg total) by mouth daily.   nitroGLYCERIN (NITROSTAT) 0.4 MG SL tablet Place 1 tablet (0.4 mg total) under the tongue every 5 (five) minutes as needed.   pravastatin (PRAVACHOL) 10 MG tablet Take 10 mg by mouth at bedtime.   tamsulosin (FLOMAX) 0.4 MG CAPS capsule Take 0.4 mg by mouth 2 (two) times daily.     Allergies:   Actos [pioglitazone],  Atenolol, Invokana [canagliflozin], Metformin and related, Motrin [ibuprofen], and Victoza [liraglutide]     EKGs/Labs/Other Studies Reviewed:    The following studies were reviewed today:  Cardiac Studies & Procedures     STRESS TESTS  MYOCARDIAL PERFUSION IMAGING 09/13/2022  Narrative   The study is normal. The study is low risk.   No ST deviation was noted.   Left ventricular function is normal. Nuclear stress EF: 66 %. The left ventricular ejection fraction is hyperdynamic (>65%). End diastolic cavity size is normal.   Prior study not available for comparison.   ECHOCARDIOGRAM  ECHOCARDIOGRAM COMPLETE 09/12/2022  Narrative ECHOCARDIOGRAM REPORT    Patient Name:   Brady [name] Date of Exam: 09/12/2022 Medical Rec #:  [id number]        Height:       64.0 in Accession #:    [id number]       Weight:       155.0 lb Date of Birth:  1946/01/03         BSA:          1.756 m Patient Age:    76 years         BP:           172/62 mmHg Patient Gender: M                HR:           62 bpm. Exam Location:  [place]  Procedure: 2D Echo, Cardiac Doppler, Color Doppler and Strain Analysis  Indications:    Nonrheumatic aortic valve stenosis [I35.0 (ICD-10-CM)]; Coronary artery disease of native artery of native heart with stable angina pectoris (HCC) [I25.118 (ICD-10-CM)]; Combined hyperlipidemia [E78.2 (ICD-10-CM)]  History:        Patient has no prior history of Echocardiogram examinations. CAD, Aortic Valve Disease; Risk Factors:Dyslipidemia.  Sonographer:    Margreta Journey RDCS Referring Phys: [id number] Jan Walters J Zhi Geier  IMPRESSIONS   1. Left ventricular ejection fraction, by estimation, is 60 to 65%. Left ventricular ejection fraction by 3D volume is 68 %. The left ventricle has normal function. The left ventricle has no regional wall motion abnormalities. Left ventricular diastolic parameters are consistent with Grade I diastolic dysfunction (impaired relaxation). The  average left ventricular global longitudinal strain is -20.9 %. The global longitudinal strain is normal. 2. Right ventricular systolic function is normal. The right ventricular size is normal. 3. Left atrial size was mild to moderately dilated. 4. Right atrial size was mild to moderately dilated. 5. The mitral valve is normal in structure. Mild mitral valve regurgitation. No evidence of mitral stenosis. 6. The aortic valve is normal in structure. There is mild calcification of the aortic valve. Aortic valve regurgitation is not visualized. Aortic valve sclerosis/calcification is present, without any evidence of aortic stenosis.  FINDINGS Left Ventricle: Left ventricular ejection fraction, by estimation, is 60 to 65%. Left ventricular  ejection fraction by 3D volume is 68 %. The left ventricle has normal function. The left ventricle has no regional wall motion abnormalities. The average left ventricular global longitudinal strain is -20.9 %. The global longitudinal strain is normal. The left ventricular internal cavity size was normal in size. There is borderline left ventricular hypertrophy. Left ventricular diastolic parameters are consistent with Grade I diastolic dysfunction (impaired relaxation). Normal left ventricular filling pressure.  Right Ventricle: The right ventricular size is normal. No increase in right ventricular wall thickness. Right ventricular systolic function is normal.  Left Atrium: Left atrial size was mild to moderately dilated.  Right Atrium: Right atrial size was mild to moderately dilated.  Pericardium: There is no evidence of pericardial effusion.  Mitral Valve: The mitral valve is normal in structure. Mild mitral valve regurgitation. No evidence of mitral valve stenosis.  Tricuspid Valve: The tricuspid valve is normal in structure. Tricuspid valve regurgitation is mild . No evidence of tricuspid stenosis.  Aortic Valve: The aortic valve is normal in structure.  There is mild calcification of the aortic valve. Aortic valve regurgitation is not visualized. Aortic valve sclerosis/calcification is present, without any evidence of aortic stenosis. Aortic valve mean gradient measures 12.2 mmHg. Aortic valve peak gradient measures 20.2 mmHg. Aortic valve area, by VTI measures 2.26 cm.  Pulmonic Valve: The pulmonic valve was normal in structure. Pulmonic valve regurgitation is not visualized. No evidence of pulmonic stenosis.  Aorta: The aortic root and ascending aorta are structurally normal, with no evidence of dilitation and the aortic arch was not well visualized.  Venous: The pulmonary veins were not well visualized. The inferior vena cava was not well visualized.  IAS/Shunts: No atrial level shunt detected by color flow Doppler.   LEFT VENTRICLE PLAX 2D LVIDd:         5.80 cm         Diastology LVIDs:         3.90 cm         LV e' medial:    6.96 cm/s LV PW:         1.00 cm         LV E/e' medial:  14.1 LV IVS:        1.10 cm         LV e' lateral:   9.90 cm/s LVOT diam:     2.30 cm         LV E/e' lateral: 9.9 LV SV:         128 LV SV Index:   73              2D LVOT Area:     4.15 cm        Longitudinal Strain 2D Strain GLS  -20.9 % Avg:  3D Volume EF LV 3D EF:    Left ventricul ar ejection fraction by 3D volume is 68 %.  3D Volume EF: 3D EF:        68 % LV EDV:       168 ml LV ESV:       54 ml LV SV:        114 ml  RIGHT VENTRICLE             IVC RV S prime:     22.20 cm/s  IVC diam: 2.20 cm TAPSE (M-mode): 3.5 cm  LEFT ATRIUM             Index  RIGHT ATRIUM           Index LA diam:        4.10 cm 2.34 cm/m   RA Area:     15.70 cm LA Vol (A2C):   79.5 ml 45.29 ml/m  RA Volume:   41.10 ml  23.41 ml/m LA Vol (A4C):   63.0 ml 35.89 ml/m LA Biplane Vol: 71.9 ml 40.96 ml/m AORTIC VALVE AV Area (Vmax):    2.24 cm AV Area (Vmean):   2.23 cm AV Area (VTI):     2.26 cm AV Vmax:           224.50 cm/s AV Vmean:           165.500 cm/s AV VTI:            0.565 m AV Peak Grad:      20.2 mmHg AV Mean Grad:      12.2 mmHg LVOT Vmax:         121.00 cm/s LVOT Vmean:        88.900 cm/s LVOT VTI:          0.308 m LVOT/AV VTI ratio: 0.55  AORTA Ao Root diam: 3.60 cm Ao Asc diam:  3.60 cm  MITRAL VALVE               TRICUSPID VALVE MV Area (PHT): 7.16 cm    TR Peak grad:   21.5 mmHg MV Decel Time: 106 msec    TR Vmax:        232.00 cm/s MR Peak grad: 149.3 mmHg MR Mean grad: 109.0 mmHg   SHUNTS MR Vmax:      611.00 cm/s  Systemic VTI:  0.31 m MR Vmean:     502.0 cm/s   Systemic Diam: 2.30 cm MV E velocity: 98.10 cm/s MV A velocity: 90.00 cm/s MV E/A ratio:  1.09  Norman Herrlich MD Electronically signed by Norman Herrlich MD Signature Date/Time: 09/12/2022/5:47:23 PM    Final              Physical Exam:    VS:  BP 130/60 (BP Location: Left Arm, Patient Position: Sitting, Cuff Size: Normal)   Pulse 74   Ht 5\' 4"  (1.626 m)   Wt 161 lb (73 kg)   SpO2 99%   BMI 27.64 kg/m     Wt Readings from Last 3 Encounters:  04/23/23 161 lb (73 kg)  10/17/22 166 lb (75.3 kg)  09/12/22 155 lb (70.3 kg)     GEN:  Well nourished, well developed in no acute distress HEENT: Normal NECK: No JVD; No carotid bruits LYMPHATICS: No lymphadenopathy CARDIAC: 1/6 systolic ejection murmur limited to the aortic area S2 is normal no aortic regurgitation RRR, no murmurs, rubs, gallops RESPIRATORY:  Clear to auscultation without rales, wheezing or rhonchi  ABDOMEN: Soft, non-tender, non-distended MUSCULOSKELETAL:  No edema; No deformity  SKIN: Warm and dry NEUROLOGIC:  Alert and oriented x 3 PSYCHIATRIC:  Normal affect    Signed, Norman Herrlich, MD  04/23/2023 11:07 AM    [facility]

## 2023-04-23 ENCOUNTER — Ambulatory Visit: Payer: PPO | Attending: Cardiology | Admitting: Cardiology

## 2023-04-23 ENCOUNTER — Encounter: Payer: Self-pay | Admitting: Cardiology

## 2023-04-23 VITALS — BP 130/60 | HR 74 | Ht 64.0 in | Wt 161.0 lb

## 2023-04-23 DIAGNOSIS — I25118 Atherosclerotic heart disease of native coronary artery with other forms of angina pectoris: Secondary | ICD-10-CM

## 2023-04-23 DIAGNOSIS — I359 Nonrheumatic aortic valve disorder, unspecified: Secondary | ICD-10-CM | POA: Diagnosis not present

## 2023-04-23 DIAGNOSIS — N184 Chronic kidney disease, stage 4 (severe): Secondary | ICD-10-CM | POA: Diagnosis not present

## 2023-04-23 DIAGNOSIS — E782 Mixed hyperlipidemia: Secondary | ICD-10-CM | POA: Diagnosis not present

## 2023-04-23 DIAGNOSIS — I129 Hypertensive chronic kidney disease with stage 1 through stage 4 chronic kidney disease, or unspecified chronic kidney disease: Secondary | ICD-10-CM

## 2023-04-23 NOTE — Patient Instructions (Signed)
Medication Instructions:  Your physician recommends that you continue on your current medications as directed. Please refer to the Current Medication list given to you today.  *If you need a refill on your cardiac medications before your next appointment, please call your pharmacy*   Lab Work: None If you have labs (blood work) drawn today and your tests are completely normal, you will receive your results only by: MyChart Message (if you have MyChart) OR A paper copy in the mail If you have any lab test that is abnormal or we need to change your treatment, we will call you to review the results.   Testing/Procedures: None   Follow-Up: At [facility], you and your health needs are our priority.  As part of our continuing mission to provide you with exceptional heart care, we have created designated Provider Care Teams.  These Care Teams include your primary Cardiologist (physician) and Advanced Practice Providers (APPs -  Physician Assistants and Nurse Practitioners) who all work together to provide you with the care you need, when you need it.  We recommend signing up for the patient portal called "MyChart".  Sign up information is provided on this After Visit Summary.  MyChart is used to connect with patients for Virtual Visits (Telemedicine).  Patients are able to view lab/test results, encounter notes, upcoming appointments, etc.  Non-urgent messages can be sent to your provider as well.   To learn more about what you can do with MyChart, go to [email or web address].    Your next appointment:   6 month(s)  Provider:   Norman Herrlich, MD    Other Instructions Check home blood pressure and bring a list to the office visits

## 2023-06-13 DIAGNOSIS — S51012A Laceration without foreign body of left elbow, initial encounter: Secondary | ICD-10-CM | POA: Diagnosis not present

## 2023-06-20 DIAGNOSIS — E785 Hyperlipidemia, unspecified: Secondary | ICD-10-CM | POA: Diagnosis not present

## 2023-06-20 DIAGNOSIS — E119 Type 2 diabetes mellitus without complications: Secondary | ICD-10-CM | POA: Diagnosis not present

## 2023-06-20 DIAGNOSIS — I10 Essential (primary) hypertension: Secondary | ICD-10-CM | POA: Diagnosis not present

## 2023-06-20 DIAGNOSIS — Z6826 Body mass index (BMI) 26.0-26.9, adult: Secondary | ICD-10-CM | POA: Diagnosis not present

## 2023-06-20 DIAGNOSIS — E162 Hypoglycemia, unspecified: Secondary | ICD-10-CM | POA: Diagnosis not present

## 2023-06-22 DIAGNOSIS — E860 Dehydration: Secondary | ICD-10-CM | POA: Diagnosis not present

## 2023-06-22 DIAGNOSIS — E875 Hyperkalemia: Secondary | ICD-10-CM | POA: Diagnosis not present

## 2023-06-22 DIAGNOSIS — R339 Retention of urine, unspecified: Secondary | ICD-10-CM | POA: Diagnosis not present

## 2023-06-22 DIAGNOSIS — Z7982 Long term (current) use of aspirin: Secondary | ICD-10-CM | POA: Diagnosis not present

## 2023-06-22 DIAGNOSIS — N179 Acute kidney failure, unspecified: Secondary | ICD-10-CM | POA: Diagnosis not present

## 2023-06-22 DIAGNOSIS — Z79899 Other long term (current) drug therapy: Secondary | ICD-10-CM | POA: Diagnosis not present

## 2023-06-22 DIAGNOSIS — M6281 Muscle weakness (generalized): Secondary | ICD-10-CM | POA: Diagnosis not present

## 2023-06-22 DIAGNOSIS — I2510 Atherosclerotic heart disease of native coronary artery without angina pectoris: Secondary | ICD-10-CM | POA: Diagnosis not present

## 2023-06-22 DIAGNOSIS — N183 Chronic kidney disease, stage 3 unspecified: Secondary | ICD-10-CM | POA: Diagnosis not present

## 2023-06-22 DIAGNOSIS — S51812D Laceration without foreign body of left forearm, subsequent encounter: Secondary | ICD-10-CM | POA: Diagnosis not present

## 2023-06-22 DIAGNOSIS — T368X5A Adverse effect of other systemic antibiotics, initial encounter: Secondary | ICD-10-CM | POA: Diagnosis not present

## 2023-06-22 DIAGNOSIS — M1990 Unspecified osteoarthritis, unspecified site: Secondary | ICD-10-CM | POA: Diagnosis not present

## 2023-06-22 DIAGNOSIS — R001 Bradycardia, unspecified: Secondary | ICD-10-CM | POA: Diagnosis not present

## 2023-06-22 DIAGNOSIS — E7800 Pure hypercholesterolemia, unspecified: Secondary | ICD-10-CM | POA: Diagnosis not present

## 2023-06-22 DIAGNOSIS — Z794 Long term (current) use of insulin: Secondary | ICD-10-CM | POA: Diagnosis not present

## 2023-06-22 DIAGNOSIS — E1122 Type 2 diabetes mellitus with diabetic chronic kidney disease: Secondary | ICD-10-CM | POA: Diagnosis not present

## 2023-06-22 DIAGNOSIS — N400 Enlarged prostate without lower urinary tract symptoms: Secondary | ICD-10-CM | POA: Diagnosis not present

## 2023-06-22 DIAGNOSIS — I129 Hypertensive chronic kidney disease with stage 1 through stage 4 chronic kidney disease, or unspecified chronic kidney disease: Secondary | ICD-10-CM | POA: Diagnosis not present

## 2023-06-22 DIAGNOSIS — Z66 Do not resuscitate: Secondary | ICD-10-CM | POA: Diagnosis not present

## 2023-06-26 DIAGNOSIS — N189 Chronic kidney disease, unspecified: Secondary | ICD-10-CM | POA: Diagnosis not present

## 2023-06-29 DIAGNOSIS — Z0000 Encounter for general adult medical examination without abnormal findings: Secondary | ICD-10-CM | POA: Diagnosis not present

## 2023-06-29 DIAGNOSIS — Z1331 Encounter for screening for depression: Secondary | ICD-10-CM | POA: Diagnosis not present

## 2023-06-29 DIAGNOSIS — E119 Type 2 diabetes mellitus without complications: Secondary | ICD-10-CM | POA: Diagnosis not present

## 2023-06-29 DIAGNOSIS — Z1339 Encounter for screening examination for other mental health and behavioral disorders: Secondary | ICD-10-CM | POA: Diagnosis not present

## 2023-06-29 DIAGNOSIS — I10 Essential (primary) hypertension: Secondary | ICD-10-CM | POA: Diagnosis not present

## 2023-06-29 DIAGNOSIS — Z6826 Body mass index (BMI) 26.0-26.9, adult: Secondary | ICD-10-CM | POA: Diagnosis not present

## 2023-06-29 DIAGNOSIS — Z789 Other specified health status: Secondary | ICD-10-CM | POA: Diagnosis not present

## 2023-06-29 DIAGNOSIS — N189 Chronic kidney disease, unspecified: Secondary | ICD-10-CM | POA: Diagnosis not present

## 2023-07-06 DIAGNOSIS — N189 Chronic kidney disease, unspecified: Secondary | ICD-10-CM | POA: Diagnosis not present

## 2023-07-09 DIAGNOSIS — E118 Type 2 diabetes mellitus with unspecified complications: Secondary | ICD-10-CM | POA: Diagnosis not present

## 2023-07-09 DIAGNOSIS — Z794 Long term (current) use of insulin: Secondary | ICD-10-CM | POA: Diagnosis not present

## 2023-07-09 DIAGNOSIS — E8722 Chronic metabolic acidosis: Secondary | ICD-10-CM | POA: Diagnosis not present

## 2023-07-09 DIAGNOSIS — N189 Chronic kidney disease, unspecified: Secondary | ICD-10-CM | POA: Diagnosis not present

## 2023-07-09 DIAGNOSIS — I10 Essential (primary) hypertension: Secondary | ICD-10-CM | POA: Diagnosis not present

## 2023-07-09 DIAGNOSIS — E875 Hyperkalemia: Secondary | ICD-10-CM | POA: Diagnosis not present

## 2023-07-16 DIAGNOSIS — N184 Chronic kidney disease, stage 4 (severe): Secondary | ICD-10-CM | POA: Diagnosis not present

## 2023-07-19 DIAGNOSIS — I1A0 Resistant hypertension: Secondary | ICD-10-CM | POA: Diagnosis not present

## 2023-07-19 DIAGNOSIS — E785 Hyperlipidemia, unspecified: Secondary | ICD-10-CM | POA: Diagnosis not present

## 2023-07-19 DIAGNOSIS — E1122 Type 2 diabetes mellitus with diabetic chronic kidney disease: Secondary | ICD-10-CM | POA: Diagnosis not present

## 2023-07-19 DIAGNOSIS — N1832 Chronic kidney disease, stage 3b: Secondary | ICD-10-CM | POA: Diagnosis not present

## 2023-07-19 DIAGNOSIS — N184 Chronic kidney disease, stage 4 (severe): Secondary | ICD-10-CM | POA: Diagnosis not present

## 2023-07-24 ENCOUNTER — Other Ambulatory Visit: Payer: Self-pay | Admitting: Internal Medicine

## 2023-07-24 DIAGNOSIS — I1A0 Resistant hypertension: Secondary | ICD-10-CM

## 2023-07-24 DIAGNOSIS — N184 Chronic kidney disease, stage 4 (severe): Secondary | ICD-10-CM

## 2023-07-24 DIAGNOSIS — E785 Hyperlipidemia, unspecified: Secondary | ICD-10-CM

## 2023-07-26 DIAGNOSIS — N184 Chronic kidney disease, stage 4 (severe): Secondary | ICD-10-CM | POA: Diagnosis not present

## 2023-08-01 DIAGNOSIS — K219 Gastro-esophageal reflux disease without esophagitis: Secondary | ICD-10-CM | POA: Diagnosis not present

## 2023-08-01 DIAGNOSIS — R531 Weakness: Secondary | ICD-10-CM | POA: Diagnosis not present

## 2023-08-01 DIAGNOSIS — I361 Nonrheumatic tricuspid (valve) insufficiency: Secondary | ICD-10-CM | POA: Diagnosis not present

## 2023-08-01 DIAGNOSIS — Z79899 Other long term (current) drug therapy: Secondary | ICD-10-CM | POA: Diagnosis not present

## 2023-08-01 DIAGNOSIS — Z882 Allergy status to sulfonamides status: Secondary | ICD-10-CM | POA: Diagnosis not present

## 2023-08-01 DIAGNOSIS — Z7901 Long term (current) use of anticoagulants: Secondary | ICD-10-CM | POA: Diagnosis not present

## 2023-08-01 DIAGNOSIS — Z794 Long term (current) use of insulin: Secondary | ICD-10-CM | POA: Diagnosis not present

## 2023-08-01 DIAGNOSIS — M1990 Unspecified osteoarthritis, unspecified site: Secondary | ICD-10-CM | POA: Diagnosis not present

## 2023-08-01 DIAGNOSIS — I493 Ventricular premature depolarization: Secondary | ICD-10-CM | POA: Diagnosis not present

## 2023-08-01 DIAGNOSIS — I480 Paroxysmal atrial fibrillation: Secondary | ICD-10-CM | POA: Diagnosis not present

## 2023-08-01 DIAGNOSIS — E1165 Type 2 diabetes mellitus with hyperglycemia: Secondary | ICD-10-CM | POA: Diagnosis not present

## 2023-08-01 DIAGNOSIS — E7800 Pure hypercholesterolemia, unspecified: Secondary | ICD-10-CM | POA: Diagnosis not present

## 2023-08-01 DIAGNOSIS — I2510 Atherosclerotic heart disease of native coronary artery without angina pectoris: Secondary | ICD-10-CM | POA: Diagnosis not present

## 2023-08-01 DIAGNOSIS — E8729 Other acidosis: Secondary | ICD-10-CM | POA: Diagnosis not present

## 2023-08-01 DIAGNOSIS — E871 Hypo-osmolality and hyponatremia: Secondary | ICD-10-CM | POA: Diagnosis not present

## 2023-08-01 DIAGNOSIS — R011 Cardiac murmur, unspecified: Secondary | ICD-10-CM | POA: Diagnosis not present

## 2023-08-01 DIAGNOSIS — N400 Enlarged prostate without lower urinary tract symptoms: Secondary | ICD-10-CM | POA: Diagnosis not present

## 2023-08-01 DIAGNOSIS — E1122 Type 2 diabetes mellitus with diabetic chronic kidney disease: Secondary | ICD-10-CM | POA: Diagnosis not present

## 2023-08-01 DIAGNOSIS — Z7984 Long term (current) use of oral hypoglycemic drugs: Secondary | ICD-10-CM | POA: Diagnosis not present

## 2023-08-01 DIAGNOSIS — N184 Chronic kidney disease, stage 4 (severe): Secondary | ICD-10-CM | POA: Diagnosis not present

## 2023-08-01 DIAGNOSIS — E860 Dehydration: Secondary | ICD-10-CM | POA: Diagnosis not present

## 2023-08-01 DIAGNOSIS — E878 Other disorders of electrolyte and fluid balance, not elsewhere classified: Secondary | ICD-10-CM | POA: Diagnosis not present

## 2023-08-01 DIAGNOSIS — I10 Essential (primary) hypertension: Secondary | ICD-10-CM | POA: Diagnosis not present

## 2023-08-01 DIAGNOSIS — Z23 Encounter for immunization: Secondary | ICD-10-CM | POA: Diagnosis not present

## 2023-08-01 DIAGNOSIS — I129 Hypertensive chronic kidney disease with stage 1 through stage 4 chronic kidney disease, or unspecified chronic kidney disease: Secondary | ICD-10-CM | POA: Diagnosis not present

## 2023-08-01 DIAGNOSIS — N179 Acute kidney failure, unspecified: Secondary | ICD-10-CM | POA: Diagnosis not present

## 2023-08-01 DIAGNOSIS — Z7982 Long term (current) use of aspirin: Secondary | ICD-10-CM | POA: Diagnosis not present

## 2023-08-03 DIAGNOSIS — I480 Paroxysmal atrial fibrillation: Secondary | ICD-10-CM | POA: Diagnosis not present

## 2023-08-04 DIAGNOSIS — I361 Nonrheumatic tricuspid (valve) insufficiency: Secondary | ICD-10-CM | POA: Diagnosis not present

## 2023-08-05 DIAGNOSIS — I2510 Atherosclerotic heart disease of native coronary artery without angina pectoris: Secondary | ICD-10-CM

## 2023-08-05 DIAGNOSIS — R011 Cardiac murmur, unspecified: Secondary | ICD-10-CM | POA: Diagnosis not present

## 2023-08-05 DIAGNOSIS — I129 Hypertensive chronic kidney disease with stage 1 through stage 4 chronic kidney disease, or unspecified chronic kidney disease: Secondary | ICD-10-CM | POA: Diagnosis not present

## 2023-08-05 DIAGNOSIS — I480 Paroxysmal atrial fibrillation: Secondary | ICD-10-CM | POA: Diagnosis not present

## 2023-08-05 DIAGNOSIS — E871 Hypo-osmolality and hyponatremia: Secondary | ICD-10-CM | POA: Diagnosis not present

## 2023-08-06 DIAGNOSIS — E871 Hypo-osmolality and hyponatremia: Secondary | ICD-10-CM | POA: Diagnosis not present

## 2023-08-06 DIAGNOSIS — I480 Paroxysmal atrial fibrillation: Secondary | ICD-10-CM | POA: Diagnosis not present

## 2023-08-06 DIAGNOSIS — I129 Hypertensive chronic kidney disease with stage 1 through stage 4 chronic kidney disease, or unspecified chronic kidney disease: Secondary | ICD-10-CM | POA: Diagnosis not present

## 2023-08-06 DIAGNOSIS — I2510 Atherosclerotic heart disease of native coronary artery without angina pectoris: Secondary | ICD-10-CM | POA: Diagnosis not present

## 2023-08-09 DIAGNOSIS — N184 Chronic kidney disease, stage 4 (severe): Secondary | ICD-10-CM | POA: Diagnosis not present

## 2023-08-13 DIAGNOSIS — Z882 Allergy status to sulfonamides status: Secondary | ICD-10-CM | POA: Diagnosis not present

## 2023-08-13 DIAGNOSIS — Z955 Presence of coronary angioplasty implant and graft: Secondary | ICD-10-CM | POA: Diagnosis not present

## 2023-08-13 DIAGNOSIS — I2510 Atherosclerotic heart disease of native coronary artery without angina pectoris: Secondary | ICD-10-CM | POA: Diagnosis not present

## 2023-08-13 DIAGNOSIS — N179 Acute kidney failure, unspecified: Secondary | ICD-10-CM | POA: Diagnosis not present

## 2023-08-13 DIAGNOSIS — R9431 Abnormal electrocardiogram [ECG] [EKG]: Secondary | ICD-10-CM | POA: Diagnosis not present

## 2023-08-13 DIAGNOSIS — E119 Type 2 diabetes mellitus without complications: Secondary | ICD-10-CM | POA: Diagnosis not present

## 2023-08-13 DIAGNOSIS — I252 Old myocardial infarction: Secondary | ICD-10-CM | POA: Diagnosis not present

## 2023-08-13 DIAGNOSIS — R531 Weakness: Secondary | ICD-10-CM | POA: Diagnosis not present

## 2023-08-13 DIAGNOSIS — M1990 Unspecified osteoarthritis, unspecified site: Secondary | ICD-10-CM | POA: Diagnosis not present

## 2023-08-13 DIAGNOSIS — Z66 Do not resuscitate: Secondary | ICD-10-CM | POA: Diagnosis not present

## 2023-08-13 DIAGNOSIS — R001 Bradycardia, unspecified: Secondary | ICD-10-CM | POA: Diagnosis not present

## 2023-08-13 DIAGNOSIS — N400 Enlarged prostate without lower urinary tract symptoms: Secondary | ICD-10-CM | POA: Diagnosis not present

## 2023-08-13 DIAGNOSIS — I4891 Unspecified atrial fibrillation: Secondary | ICD-10-CM | POA: Diagnosis not present

## 2023-08-13 DIAGNOSIS — I10 Essential (primary) hypertension: Secondary | ICD-10-CM | POA: Diagnosis not present

## 2023-08-13 DIAGNOSIS — Z7901 Long term (current) use of anticoagulants: Secondary | ICD-10-CM | POA: Diagnosis not present

## 2023-08-13 DIAGNOSIS — Z794 Long term (current) use of insulin: Secondary | ICD-10-CM | POA: Diagnosis not present

## 2023-08-13 DIAGNOSIS — E7800 Pure hypercholesterolemia, unspecified: Secondary | ICD-10-CM | POA: Diagnosis not present

## 2023-08-13 DIAGNOSIS — N184 Chronic kidney disease, stage 4 (severe): Secondary | ICD-10-CM | POA: Diagnosis not present

## 2023-08-13 DIAGNOSIS — I129 Hypertensive chronic kidney disease with stage 1 through stage 4 chronic kidney disease, or unspecified chronic kidney disease: Secondary | ICD-10-CM | POA: Diagnosis not present

## 2023-08-13 DIAGNOSIS — Z7982 Long term (current) use of aspirin: Secondary | ICD-10-CM | POA: Diagnosis not present

## 2023-08-13 DIAGNOSIS — E871 Hypo-osmolality and hyponatremia: Secondary | ICD-10-CM | POA: Diagnosis not present

## 2023-08-13 DIAGNOSIS — N189 Chronic kidney disease, unspecified: Secondary | ICD-10-CM | POA: Diagnosis not present

## 2023-08-13 DIAGNOSIS — Z79899 Other long term (current) drug therapy: Secondary | ICD-10-CM | POA: Diagnosis not present

## 2023-08-14 DIAGNOSIS — E871 Hypo-osmolality and hyponatremia: Secondary | ICD-10-CM | POA: Diagnosis not present

## 2023-08-14 DIAGNOSIS — I4891 Unspecified atrial fibrillation: Secondary | ICD-10-CM | POA: Diagnosis not present

## 2023-08-14 DIAGNOSIS — N179 Acute kidney failure, unspecified: Secondary | ICD-10-CM | POA: Diagnosis not present

## 2023-08-15 DIAGNOSIS — E871 Hypo-osmolality and hyponatremia: Secondary | ICD-10-CM | POA: Diagnosis not present

## 2023-08-15 DIAGNOSIS — N179 Acute kidney failure, unspecified: Secondary | ICD-10-CM | POA: Diagnosis not present

## 2023-08-15 DIAGNOSIS — I4891 Unspecified atrial fibrillation: Secondary | ICD-10-CM | POA: Diagnosis not present

## 2023-08-16 ENCOUNTER — Other Ambulatory Visit: Payer: Self-pay

## 2023-08-16 ENCOUNTER — Telehealth: Payer: Self-pay | Admitting: Cardiology

## 2023-08-16 NOTE — Telephone Encounter (Signed)
Patient was prescribed Amiodarone in the hospital- patient would like to know Dr. Hulen Shouts thoughts on how much he feels he should be taking.  Please advise. Patient knows Dr. Dulce Sellar is on vacation and would like the advice of the DOD today if possible.   Please call [phone]

## 2023-08-16 NOTE — Telephone Encounter (Signed)
Attempted to call patient. Patient did not answer the phone and no voice mail to leave a message

## 2023-08-17 ENCOUNTER — Other Ambulatory Visit: Payer: Self-pay

## 2023-08-17 MED ORDER — AMIODARONE HCL 100 MG PO TABS: 100.0000 mg | ORAL_TABLET | Freq: Every day | ORAL | 3 refills | Status: DC

## 2023-08-17 NOTE — Telephone Encounter (Signed)
Called patient and he reported that he went to the ER this past Monday because his heart rate was in the 40's and he was admitted and found to be in A-fib. Per the patient he was put on Amiodarone and when he converted to SR the Amiodarone was stopped while he was in the hospital. He was discharged on Wednesday and currently his heart rate is in the 60's and the patient feels fine. Patient is calling today to see if he should still be taking the Amiodarone and if so then how much?

## 2023-08-17 NOTE — Telephone Encounter (Signed)
Called patient and informed him of Dr. Vanetta Shawl recommendation below:  "Can put him on 100 mg of amiodarone daily and check what the heart rate will be on that setting"  Patient verbalized understanding and had no further questions at this time.

## 2023-08-20 DIAGNOSIS — Z6829 Body mass index (BMI) 29.0-29.9, adult: Secondary | ICD-10-CM | POA: Diagnosis not present

## 2023-08-20 DIAGNOSIS — E118 Type 2 diabetes mellitus with unspecified complications: Secondary | ICD-10-CM | POA: Diagnosis not present

## 2023-08-20 DIAGNOSIS — I10 Essential (primary) hypertension: Secondary | ICD-10-CM | POA: Diagnosis not present

## 2023-08-20 DIAGNOSIS — Z794 Long term (current) use of insulin: Secondary | ICD-10-CM | POA: Diagnosis not present

## 2023-08-22 ENCOUNTER — Encounter: Payer: Self-pay | Admitting: Internal Medicine

## 2023-08-22 DIAGNOSIS — I10 Essential (primary) hypertension: Secondary | ICD-10-CM | POA: Insufficient documentation

## 2023-08-22 DIAGNOSIS — E119 Type 2 diabetes mellitus without complications: Secondary | ICD-10-CM | POA: Insufficient documentation

## 2023-08-22 NOTE — Progress Notes (Unsigned)
Cardiology Office Note:    Date:  08/23/2023   ID:  Brady [name], DOB 1946/10/07, MRN [id number]  PCP:  Annamaria Helling, DO  Cardiologist:  Norman Herrlich, MD    Referring MD: Annamaria Helling, DO    ASSESSMENT:    1. On amiodarone therapy   2. Paroxysmal atrial fibrillation (HCC)   3. Chronic anticoagulation   4. Coronary artery disease of native artery of native heart with stable angina pectoris (HCC)   5. 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 Aortic valve calcification   7. Hyponatremia    PLAN:    In order of problems listed above:  He is taking a very low-dose of amiodarone I am not sure he is fully loaded I am going to go back to 40 mg a day orally for 2 weeks we will reassess his EKG see him in the office in 4 weeks make a decision about the need for cardioversion I suspect he will go back into sinus rhythm Continue his anticoagulant Stable CAD no evidence of ACS continue medical treatment including his statin I asked him to purchase a new Omron cuff good technique continue to measure blood pressures and bring a list to his next office visit. Multidrug regimen including amlodipine ACE inhibitor clonidine. I will draw labs today even though its only more than a week from hospital discharge including renal function and thyroids with amiodarone   Next appointment: 4 weeks   Medication Adjustments/Labs and Tests Ordered: Current medicines are reviewed at length with the patient today.  Concerns regarding medicines are outlined above.  Orders Placed This Encounter  Procedures   EKG 12-Lead   No orders of the defined types were placed in this encounter.    History of Present Illness:    Brady [name] is a 77 y.o. male with a hx of CAD with multiple PCI 1989 hypertensive kidney disease stage IV hyperlipidemia and aortic valve calcification without stenosis last seen 04/23/2023.  He was recently admitted to [facility] episode of weakness diaphoresis found to be in rate controlled atrial fibrillation and admitted to the hospital with severe symptomatic hyponatremia.  He is initiated on anticoagulant therapy with spontaneously cardiovert the sinus rhythm with amiodarone therapy.  In the hospital hemoglobin 13.6 platelets 20 56,000 creatinine 2.8 his hyponatremia was corrected CAD was stable without evidence of acute coronary syndrome.  Compliance with diet, lifestyle and medications: Yes  He was decreased to 100 mg a day of amiodarone hospital discharge Today he is in rate controlled atrial fibrillation I am going to increase him back to 400 mg a day 2 weeks return for an EKG follow-up in the office 4 weeks and if he continues in atrial fibrillation we will need to consider cardioversion. He is feeling better he is restricting his salt he is not had lab work performed In my office today as blood pressure is ideal he tells me it is always good to physicians offices but is high at home he is getting readings as high as 178/65 I suspect his device is not valid and asked him to purchase a new Omron blood pressure cuff and measure with good technique. He is a mild degree pedal edema related to his calcium channel blocker no orthopnea shortness of breath chest pain palpitation or syncope Past Medical History:  Diagnosis Date   Chronic ischemic heart disease    Chronic kidney disease    Combined hyperlipidemia  Dermatophytoses    nail, failed terbinafine rx   Diabetes mellitus with renal complications (HCC)    Diabetes mellitus without complication (HCC)    Elevated PSA    with prostatic hypertrophy followed by Dr. Laverle Patter   Essential hypertension    Hypertension    Obese    Pernicious anemia    Uncontrolled type 2 diabetes mellitus with hyperglycemia, with long-term current use of insulin (HCC)     Current Medications: Current Meds  Medication Sig   amiodarone (PACERONE) 100 MG tablet Take 1  tablet (100 mg total) by mouth daily.   amLODipine (NORVASC) 10 MG tablet Take 10 mg by mouth daily.   aspirin EC 81 MG tablet Take 81 mg by mouth daily. Swallow whole.   benazepril (LOTENSIN) 20 MG tablet Take 20 mg by mouth daily.   cloNIDine (CATAPRES) 0.1 MG tablet Take 0.1 mg by mouth 3 (three) times daily.   cyanocobalamin (VITAMIN B12) 1000 MCG tablet Take 2,000 mcg by mouth daily.   ELIQUIS 5 MG TABS tablet Take 5 mg by mouth 2 (two) times daily.   finasteride (PROSCAR) 5 MG tablet Take 5 mg by mouth daily.   Insulin Glargine (BASAGLAR KWIKPEN) 100 UNIT/ML Inject 8 Units into the skin in the morning.   insulin lispro (HUMALOG KWIKPEN) 100 UNIT/ML KwikPen Inject 6 Units into the skin in the morning. 8 units prior to lunch and 4 units prior to evening meal   isosorbide mononitrate (IMDUR) 30 MG 24 hr tablet Take 1 tablet (30 mg total) by mouth daily.   pantoprazole (PROTONIX) 40 MG tablet Take 40 mg by mouth every morning.   pravastatin (PRAVACHOL) 10 MG tablet Take 10 mg by mouth at bedtime.   sodium bicarbonate 650 MG tablet Take 1,300 mg by mouth 3 (three) times daily.   tamsulosin (FLOMAX) 0.4 MG CAPS capsule Take 0.4 mg by mouth 2 (two) times daily.      EKGs/Labs/Other Studies Reviewed:    The following studies were reviewed today:  Cardiac Studies & Procedures     STRESS TESTS  MYOCARDIAL PERFUSION IMAGING 09/12/2022  Narrative   The study is normal. The study is low risk.   No ST deviation was noted.   Left ventricular function is normal. Nuclear stress EF: 66 %. The left ventricular ejection fraction is hyperdynamic (>65%). End diastolic cavity size is normal.   Prior study not available for comparison.   ECHOCARDIOGRAM  ECHOCARDIOGRAM COMPLETE 09/12/2022  Narrative ECHOCARDIOGRAM REPORT    Patient Name:   Arcadio R [name] Date of Exam: 09/12/2022 Medical Rec #:  [id number]        Height:       64.0 in Accession #:    [id number]       Weight:       155.0  lb Date of Birth:  09-Feb-1946         BSA:          1.756 m Patient Age:    76 years         BP:           172/62 mmHg Patient Gender: M                HR:           62 bpm. Exam Location:    Procedure: 2D Echo, Cardiac Doppler, Color Doppler and Strain Analysis  Indications:    Nonrheumatic aortic valve stenosis [I35.0 (ICD-10-CM)]; Coronary artery disease of native artery of  Dermatophytoses    nail, failed terbinafine rx   Diabetes mellitus with renal complications (HCC)    Diabetes mellitus without complication (HCC)    Elevated PSA    with prostatic hypertrophy followed by Dr. Laverle Patter   Essential hypertension    Hypertension    Obese    Pernicious anemia    Uncontrolled type 2 diabetes mellitus with hyperglycemia, with long-term current use of insulin (HCC)     Current Medications: Current Meds  Medication Sig   amiodarone (PACERONE) 100 MG tablet Take 1  tablet (100 mg total) by mouth daily.   amLODipine (NORVASC) 10 MG tablet Take 10 mg by mouth daily.   aspirin EC 81 MG tablet Take 81 mg by mouth daily. Swallow whole.   benazepril (LOTENSIN) 20 MG tablet Take 20 mg by mouth daily.   cloNIDine (CATAPRES) 0.1 MG tablet Take 0.1 mg by mouth 3 (three) times daily.   cyanocobalamin (VITAMIN B12) 1000 MCG tablet Take 2,000 mcg by mouth daily.   ELIQUIS 5 MG TABS tablet Take 5 mg by mouth 2 (two) times daily.   finasteride (PROSCAR) 5 MG tablet Take 5 mg by mouth daily.   Insulin Glargine (BASAGLAR KWIKPEN) 100 UNIT/ML Inject 8 Units into the skin in the morning.   insulin lispro (HUMALOG KWIKPEN) 100 UNIT/ML KwikPen Inject 6 Units into the skin in the morning. 8 units prior to lunch and 4 units prior to evening meal   isosorbide mononitrate (IMDUR) 30 MG 24 hr tablet Take 1 tablet (30 mg total) by mouth daily.   pantoprazole (PROTONIX) 40 MG tablet Take 40 mg by mouth every morning.   pravastatin (PRAVACHOL) 10 MG tablet Take 10 mg by mouth at bedtime.   sodium bicarbonate 650 MG tablet Take 1,300 mg by mouth 3 (three) times daily.   tamsulosin (FLOMAX) 0.4 MG CAPS capsule Take 0.4 mg by mouth 2 (two) times daily.      EKGs/Labs/Other Studies Reviewed:    The following studies were reviewed today:  Cardiac Studies & Procedures     STRESS TESTS  MYOCARDIAL PERFUSION IMAGING 09/12/2022  Narrative   The study is normal. The study is low risk.   No ST deviation was noted.   Left ventricular function is normal. Nuclear stress EF: 66 %. The left ventricular ejection fraction is hyperdynamic (>65%). End diastolic cavity size is normal.   Prior study not available for comparison.   ECHOCARDIOGRAM  ECHOCARDIOGRAM COMPLETE 09/12/2022  Narrative ECHOCARDIOGRAM REPORT    Patient Name:   Arcadio R [name] Date of Exam: 09/12/2022 Medical Rec #:  [id number]        Height:       64.0 in Accession #:    [id number]       Weight:       155.0  lb Date of Birth:  09-Feb-1946         BSA:          1.756 m Patient Age:    76 years         BP:           172/62 mmHg Patient Gender: M                HR:           62 bpm. Exam Location:    Procedure: 2D Echo, Cardiac Doppler, Color Doppler and Strain Analysis  Indications:    Nonrheumatic aortic valve stenosis [I35.0 (ICD-10-CM)]; Coronary artery disease of native artery of  Cardiology Office Note:    Date:  08/23/2023   ID:  Brady [name], DOB 1946/10/07, MRN [id number]  PCP:  Annamaria Helling, DO  Cardiologist:  Norman Herrlich, MD    Referring MD: Annamaria Helling, DO    ASSESSMENT:    1. On amiodarone therapy   2. Paroxysmal atrial fibrillation (HCC)   3. Chronic anticoagulation   4. Coronary artery disease of native artery of native heart with stable angina pectoris (HCC)   5. 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 Aortic valve calcification   7. Hyponatremia    PLAN:    In order of problems listed above:  He is taking a very low-dose of amiodarone I am not sure he is fully loaded I am going to go back to 40 mg a day orally for 2 weeks we will reassess his EKG see him in the office in 4 weeks make a decision about the need for cardioversion I suspect he will go back into sinus rhythm Continue his anticoagulant Stable CAD no evidence of ACS continue medical treatment including his statin I asked him to purchase a new Omron cuff good technique continue to measure blood pressures and bring a list to his next office visit. Multidrug regimen including amlodipine ACE inhibitor clonidine. I will draw labs today even though its only more than a week from hospital discharge including renal function and thyroids with amiodarone   Next appointment: 4 weeks   Medication Adjustments/Labs and Tests Ordered: Current medicines are reviewed at length with the patient today.  Concerns regarding medicines are outlined above.  Orders Placed This Encounter  Procedures   EKG 12-Lead   No orders of the defined types were placed in this encounter.    History of Present Illness:    Brady [name] is a 77 y.o. male with a hx of CAD with multiple PCI 1989 hypertensive kidney disease stage IV hyperlipidemia and aortic valve calcification without stenosis last seen 04/23/2023.  He was recently admitted to [facility] episode of weakness diaphoresis found to be in rate controlled atrial fibrillation and admitted to the hospital with severe symptomatic hyponatremia.  He is initiated on anticoagulant therapy with spontaneously cardiovert the sinus rhythm with amiodarone therapy.  In the hospital hemoglobin 13.6 platelets 20 56,000 creatinine 2.8 his hyponatremia was corrected CAD was stable without evidence of acute coronary syndrome.  Compliance with diet, lifestyle and medications: Yes  He was decreased to 100 mg a day of amiodarone hospital discharge Today he is in rate controlled atrial fibrillation I am going to increase him back to 400 mg a day 2 weeks return for an EKG follow-up in the office 4 weeks and if he continues in atrial fibrillation we will need to consider cardioversion. He is feeling better he is restricting his salt he is not had lab work performed In my office today as blood pressure is ideal he tells me it is always good to physicians offices but is high at home he is getting readings as high as 178/65 I suspect his device is not valid and asked him to purchase a new Omron blood pressure cuff and measure with good technique. He is a mild degree pedal edema related to his calcium channel blocker no orthopnea shortness of breath chest pain palpitation or syncope Past Medical History:  Diagnosis Date   Chronic ischemic heart disease    Chronic kidney disease    Combined hyperlipidemia  Dermatophytoses    nail, failed terbinafine rx   Diabetes mellitus with renal complications (HCC)    Diabetes mellitus without complication (HCC)    Elevated PSA    with prostatic hypertrophy followed by Dr. Laverle Patter   Essential hypertension    Hypertension    Obese    Pernicious anemia    Uncontrolled type 2 diabetes mellitus with hyperglycemia, with long-term current use of insulin (HCC)     Current Medications: Current Meds  Medication Sig   amiodarone (PACERONE) 100 MG tablet Take 1  tablet (100 mg total) by mouth daily.   amLODipine (NORVASC) 10 MG tablet Take 10 mg by mouth daily.   aspirin EC 81 MG tablet Take 81 mg by mouth daily. Swallow whole.   benazepril (LOTENSIN) 20 MG tablet Take 20 mg by mouth daily.   cloNIDine (CATAPRES) 0.1 MG tablet Take 0.1 mg by mouth 3 (three) times daily.   cyanocobalamin (VITAMIN B12) 1000 MCG tablet Take 2,000 mcg by mouth daily.   ELIQUIS 5 MG TABS tablet Take 5 mg by mouth 2 (two) times daily.   finasteride (PROSCAR) 5 MG tablet Take 5 mg by mouth daily.   Insulin Glargine (BASAGLAR KWIKPEN) 100 UNIT/ML Inject 8 Units into the skin in the morning.   insulin lispro (HUMALOG KWIKPEN) 100 UNIT/ML KwikPen Inject 6 Units into the skin in the morning. 8 units prior to lunch and 4 units prior to evening meal   isosorbide mononitrate (IMDUR) 30 MG 24 hr tablet Take 1 tablet (30 mg total) by mouth daily.   pantoprazole (PROTONIX) 40 MG tablet Take 40 mg by mouth every morning.   pravastatin (PRAVACHOL) 10 MG tablet Take 10 mg by mouth at bedtime.   sodium bicarbonate 650 MG tablet Take 1,300 mg by mouth 3 (three) times daily.   tamsulosin (FLOMAX) 0.4 MG CAPS capsule Take 0.4 mg by mouth 2 (two) times daily.      EKGs/Labs/Other Studies Reviewed:    The following studies were reviewed today:  Cardiac Studies & Procedures     STRESS TESTS  MYOCARDIAL PERFUSION IMAGING 09/12/2022  Narrative   The study is normal. The study is low risk.   No ST deviation was noted.   Left ventricular function is normal. Nuclear stress EF: 66 %. The left ventricular ejection fraction is hyperdynamic (>65%). End diastolic cavity size is normal.   Prior study not available for comparison.   ECHOCARDIOGRAM  ECHOCARDIOGRAM COMPLETE 09/12/2022  Narrative ECHOCARDIOGRAM REPORT    Patient Name:   Arcadio R [name] Date of Exam: 09/12/2022 Medical Rec #:  [id number]        Height:       64.0 in Accession #:    [id number]       Weight:       155.0  lb Date of Birth:  09-Feb-1946         BSA:          1.756 m Patient Age:    76 years         BP:           172/62 mmHg Patient Gender: M                HR:           62 bpm. Exam Location:    Procedure: 2D Echo, Cardiac Doppler, Color Doppler and Strain Analysis  Indications:    Nonrheumatic aortic valve stenosis [I35.0 (ICD-10-CM)]; Coronary artery disease of native artery of

## 2023-08-23 ENCOUNTER — Encounter: Payer: Self-pay | Admitting: Cardiology

## 2023-08-23 ENCOUNTER — Ambulatory Visit: Payer: PPO | Attending: Cardiology | Admitting: Cardiology

## 2023-08-23 VITALS — BP 130/70 | HR 55 | Ht 63.0 in | Wt 170.4 lb

## 2023-08-23 DIAGNOSIS — N184 Chronic kidney disease, stage 4 (severe): Secondary | ICD-10-CM

## 2023-08-23 DIAGNOSIS — I480 Paroxysmal atrial fibrillation: Secondary | ICD-10-CM

## 2023-08-23 DIAGNOSIS — Z7901 Long term (current) use of anticoagulants: Secondary | ICD-10-CM | POA: Diagnosis not present

## 2023-08-23 DIAGNOSIS — I129 Hypertensive chronic kidney disease with stage 1 through stage 4 chronic kidney disease, or unspecified chronic kidney disease: Secondary | ICD-10-CM

## 2023-08-23 DIAGNOSIS — I359 Nonrheumatic aortic valve disorder, unspecified: Secondary | ICD-10-CM | POA: Diagnosis not present

## 2023-08-23 DIAGNOSIS — Z79899 Other long term (current) drug therapy: Secondary | ICD-10-CM

## 2023-08-23 DIAGNOSIS — E871 Hypo-osmolality and hyponatremia: Secondary | ICD-10-CM | POA: Diagnosis not present

## 2023-08-23 DIAGNOSIS — I25118 Atherosclerotic heart disease of native coronary artery with other forms of angina pectoris: Secondary | ICD-10-CM

## 2023-08-23 MED ORDER — AMIODARONE HCL 200 MG PO TABS: 200.0000 mg | ORAL_TABLET | Freq: Two times a day (BID) | ORAL | 3 refills | Status: DC

## 2023-08-23 NOTE — Patient Instructions (Addendum)
Medication Instructions:  Your physician has recommended you make the following change in your medication:   START: Amiodarone 200 mg twice daily  *If you need a refill on your cardiac medications before your next appointment, please call your pharmacy*   Lab Work: Your physician recommends that you return for lab work in:   Labs today: BMP, Pro BNP, TSH T3 T4  If you have labs (blood work) drawn today and your tests are completely normal, you will receive your results only by: MyChart Message (if you have MyChart) OR A paper copy in the mail If you have any lab test that is abnormal or we need to change your treatment, we will call you to review the results.   Testing/Procedures: None   Follow-Up: At [facility], you and your health needs are our priority.  As part of our continuing mission to provide you with exceptional heart care, we have created designated Provider Care Teams.  These Care Teams include your primary Cardiologist (physician) and Advanced Practice Providers (APPs -  Physician Assistants and Nurse Practitioners) who all work together to provide you with the care you need, when you need it.  We recommend signing up for the patient portal called "MyChart".  Sign up information is provided on this After Visit Summary.  MyChart is used to connect with patients for Virtual Visits (Telemedicine).  Patients are able to view lab/test results, encounter notes, upcoming appointments, etc.  Non-urgent messages can be sent to your provider as well.   To learn more about what you can do with MyChart, go to [email or web address].    Your next appointment:   4 week(s)  Provider:   Wallis Bamberg, NP [facility])    Other Instructions Get a new Omron blood pressure cuff.  [employer]  Tips to measure your blood pressure correctly  To determine whether you have hypertension, a medical professional will take a blood pressure reading. How you prepare for the test, the  position of your arm, and other factors can change a blood pressure reading by 10% or more. That could be enough to hide high blood pressure, start you on a drug you don't really need, or lead your doctor to incorrectly adjust your medications. National and international guidelines offer specific instructions for measuring blood pressure. If a doctor, nurse, or medical assistant isn't doing it right, don't hesitate to ask him or her to get with the guidelines. Here's what you can do to ensure a correct reading:  Don't drink a caffeinated beverage or smoke during the 30 minutes before the test.  Sit quietly for five minutes before the test begins.  During the measurement, sit in a chair with your feet on the floor and your arm supported so your elbow is at about heart level.  The inflatable part of the cuff should completely cover at least 80% of your upper arm, and the cuff should be placed on bare skin, not over a shirt.  Don't talk during the measurement.  Have your blood pressure measured twice, with a brief break in between. If the readings are different by 5 points or more, have it done a third time. There are times to break these rules. If you sometimes feel lightheaded when getting out of bed in the morning or when you stand after sitting, you should have your blood pressure checked while seated and then while standing to see if it falls from one position to the next. Because blood pressure varies throughout the day, your  doctor will rarely diagnose hypertension on the basis of a single reading. Instead, he or she will want to confirm the measurements on at least two occasions, usually within a few weeks of one another. The exception to this rule is if you have a blood pressure reading of 180/110 mm Hg or higher. A result this high usually calls for prompt treatment. It's also a good idea to have your blood pressure measured in both arms at least once, since the reading in one arm (usually the  right) may be higher than that in the left. A 2014 study in The American Journal of Medicine of nearly 3,400 people found average arm- to-arm differences in systolic blood pressure of about 5 points. The higher number should be used to make treatment decisions. In 2017, new guidelines from the American Heart Association, the [employer] of Cardiology, and nine other health organizations lowered the diagnosis of high blood pressure to 130/80 mm Hg or higher for all adults. The guidelines also redefined the various blood pressure categories to now include normal, elevated, Stage 1 hypertension, Stage 2 hypertension, and hypertensive crisis (see "Blood pressure categories"). Blood pressure categories  Blood pressure category SYSTOLIC (upper number)  DIASTOLIC (lower number)  Normal Less than 120 mm Hg and Less than 80 mm Hg  Elevated 120-129 mm Hg and Less than 80 mm Hg  High blood pressure: Stage 1 hypertension 130-139 mm Hg or 80-89 mm Hg  High blood pressure: Stage 2 hypertension 140 mm Hg or higher or 90 mm Hg or higher  Hypertensive crisis (consult your doctor immediately) Higher than 180 mm Hg and/or Higher than 120 mm Hg  Source: American Heart Association and American Stroke Association. For more on getting your blood pressure under control, buy Controlling Your Blood Pressure, a Special Health Report from [facility].

## 2023-08-24 LAB — BASIC METABOLIC PANEL
BUN/Creatinine Ratio: 9 — ABNORMAL LOW (ref 10–24)
BUN: 25 mg/dL (ref 8–27)
CO2: 27 mmol/L (ref 20–29)
Calcium: 8.5 mg/dL — ABNORMAL LOW (ref 8.6–10.2)
Chloride: 98 mmol/L (ref 96–106)
Creatinine, Ser: 2.84 mg/dL — ABNORMAL HIGH (ref 0.76–1.27)
Glucose: 175 mg/dL — ABNORMAL HIGH (ref 70–99)
Potassium: 3.9 mmol/L (ref 3.5–5.2)
Sodium: 139 mmol/L (ref 134–144)
eGFR: 22 mL/min/{1.73_m2} — ABNORMAL LOW (ref >59)

## 2023-08-24 LAB — TSH+T4F+T3FREE
Free T4: 1.44 ng/dL (ref 0.82–1.77)
T3, Free: 1.8 pg/mL — ABNORMAL LOW (ref 2.0–4.4)
TSH: 1.78 u[IU]/mL (ref 0.450–4.500)

## 2023-08-24 LAB — PRO B NATRIURETIC PEPTIDE: NT-Pro BNP: 8697 pg/mL — ABNORMAL HIGH (ref 0–486)

## 2023-08-28 ENCOUNTER — Encounter: Payer: Self-pay | Admitting: Cardiology

## 2023-08-29 DIAGNOSIS — N184 Chronic kidney disease, stage 4 (severe): Secondary | ICD-10-CM | POA: Diagnosis not present

## 2023-08-29 DIAGNOSIS — I1A0 Resistant hypertension: Secondary | ICD-10-CM | POA: Diagnosis not present

## 2023-08-29 DIAGNOSIS — I259 Chronic ischemic heart disease, unspecified: Secondary | ICD-10-CM | POA: Diagnosis not present

## 2023-08-29 DIAGNOSIS — E871 Hypo-osmolality and hyponatremia: Secondary | ICD-10-CM | POA: Diagnosis not present

## 2023-08-29 DIAGNOSIS — E785 Hyperlipidemia, unspecified: Secondary | ICD-10-CM | POA: Diagnosis not present

## 2023-08-29 DIAGNOSIS — E1122 Type 2 diabetes mellitus with diabetic chronic kidney disease: Secondary | ICD-10-CM | POA: Diagnosis not present

## 2023-09-04 ENCOUNTER — Encounter: Payer: Self-pay | Admitting: Internal Medicine

## 2023-09-06 ENCOUNTER — Ambulatory Visit: Payer: PPO | Attending: Cardiology

## 2023-09-06 VITALS — BP 122/58 | HR 61 | Ht 63.0 in | Wt 150.8 lb

## 2023-09-06 DIAGNOSIS — I480 Paroxysmal atrial fibrillation: Secondary | ICD-10-CM

## 2023-09-06 NOTE — Progress Notes (Signed)
   Nurse Visit   Date of Encounter: 09/06/2023 ID: Brady [name], DOB [birth year], MRN [id number]  PCP:  Annamaria Helling, DO   [facility] Providers Cardiologist:  None      Visit Details   VS:  BP (!) 122/58   Pulse 61   Ht 5\' 3"  (1.6 m)   Wt 150 lb 12.8 oz (68.4 kg)   SpO2 98%   BMI 26.71 kg/m  , BMI Body mass index is 26.71 kg/m.  Wt Readings from Last 3 Encounters:  09/06/23 150 lb 12.8 oz (68.4 kg)  08/23/23 170 lb 6.4 oz (77.3 kg)  04/23/23 161 lb (73 kg)     Reason for visit: Perform an EKG Performed today: Vitals, EKG, Provider consulted and Education Changes (medications, testing, etc.) : No new orders Length of Visit: 25 minutes    Medications Adjustments/Labs and Tests Ordered: Orders Placed This Encounter  Procedures   EKG 12-Lead   No orders of the defined types were placed in this encounter.    Signed, Samson Frederic, RN  09/06/2023 5:19 PM

## 2023-09-10 ENCOUNTER — Other Ambulatory Visit: Payer: Self-pay

## 2023-09-10 MED ORDER — AMIODARONE HCL 200 MG PO TABS: 200.0000 mg | ORAL_TABLET | Freq: Every day | ORAL | 3 refills | Status: DC

## 2023-09-13 DIAGNOSIS — I1A0 Resistant hypertension: Secondary | ICD-10-CM | POA: Diagnosis not present

## 2023-09-13 DIAGNOSIS — I259 Chronic ischemic heart disease, unspecified: Secondary | ICD-10-CM | POA: Diagnosis not present

## 2023-09-13 DIAGNOSIS — E871 Hypo-osmolality and hyponatremia: Secondary | ICD-10-CM | POA: Diagnosis not present

## 2023-09-13 DIAGNOSIS — I482 Chronic atrial fibrillation, unspecified: Secondary | ICD-10-CM | POA: Diagnosis not present

## 2023-09-13 DIAGNOSIS — N184 Chronic kidney disease, stage 4 (severe): Secondary | ICD-10-CM | POA: Diagnosis not present

## 2023-09-13 DIAGNOSIS — E872 Acidosis, unspecified: Secondary | ICD-10-CM | POA: Diagnosis not present

## 2023-09-13 DIAGNOSIS — E1122 Type 2 diabetes mellitus with diabetic chronic kidney disease: Secondary | ICD-10-CM | POA: Diagnosis not present

## 2023-09-19 NOTE — Progress Notes (Unsigned)
Cardiology Office Note:  .   Date:  09/20/2023  ID:  Brady [name], DOB 09/24/1946, MRN [id number] PCP: Annamaria Helling, DO  [facility] Providers Cardiologist:  None    History of Present Illness: .   Brady [name] is a 76 y.o. male with a past medical history of PAF on amiodarone and Eliquis, CAD s/p PCI in 1989, hypertension, DM 2, CKD stage IV--follows with Dr. Valentino Nose, dyslipidemia.  08/04/2023 echo EF 60 to 65%, impaired relaxation, mild aortic sclerosis without stenosis, mild TR, PASP mild to moderately elevated at 46 mmHg 09/12/2022 echo EF 60 to 65%, grade 1 DD, LA mild to moderately dilated, RA mild to moderately dilated, mild MR, mild calcification of the aortic valve without stenosis 09/12/2022 Lexiscan normal, low risk study 1989 PCI   [name] on 08/23/2023. He had recently been admitted to [facility] for weakness found to be in atrial fibrillation, he was also hyponatremic, he he converted back to sinus rhythm with amiodarone therapy.  At his visit with Dr. Dulce Sellar he was back in atrial fibrillation, his amiodarone was increased back to 400 mg a day for 2 weeks, plans to follow back up in the office for 4 weeks for repeat EKG to see if we need to plan for cardioversion.  He presents today for follow-up of his atrial fibrillation, he is back in sinus rhythm today, tolerating his amiodarone without complaints.  He is unaware when he is out of rhythm, he did purchase a blood pressure cuff and has been checking his blood pressure at home.  He denies hematochezia, hematuria, hemoptysis.  He denies chest pain, palpitations, dyspnea, pnd, orthopnea, n, v, dizziness, syncope, edema, weight gain, or early satiety.   ROS: Review of Systems  Endo/Heme/Allergies:  Bruises/bleeds easily.  All other systems reviewed and are negative.    Studies Reviewed: Marland Kitchen   EKG Interpretation Date/Time:  Thursday September 20 2023 10:31:56 EST Ventricular  Rate:  56 PR Interval:  154 QRS Duration:  110 QT Interval:  444 QTC Calculation: 428 R Axis:   -46  Text Interpretation: Sinus bradycardia with occasional Premature ventricular complexes Left axis deviation Abnormal ECG When compared with ECG of 06-Sep-2023 09:58, Sinus rhythm has replaced Atrial fibrillation QT has shortened Confirmed by Wallis Bamberg [phone]) on 09/20/2023 10:39:55 AM    Cardiac Studies & Procedures     STRESS TESTS  MYOCARDIAL PERFUSION IMAGING 09/12/2022  Narrative   The study is normal. The study is low risk.   No ST deviation was noted.   Left ventricular function is normal. Nuclear stress EF: 66 %. The left ventricular ejection fraction is hyperdynamic (>65%). End diastolic cavity size is normal.   Prior study not available for comparison.   ECHOCARDIOGRAM  ECHOCARDIOGRAM COMPLETE 09/12/2022  Narrative ECHOCARDIOGRAM REPORT    Patient Name:   Brady [name] Date of Exam: 09/12/2022 Medical Rec #:  [id number]        Height:       64.0 in Accession #:    [id number]       Weight:       155.0 lb Date of Birth:  [birth year]         BSA:          1.756 m Patient Age:    76 years         BP:           172/62 mmHg Patient Gender: M  HR:           62 bpm. 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 CAD, Aortic Valve Disease; Risk Factors:Dyslipidemia.  Sonographer:    Margreta Journey RDCS Referring Phys: [id number] BRIAN J MUNLEY  IMPRESSIONS   1. Left ventricular ejection fraction, by estimation, is 60 to 65%. Left ventricular ejection fraction by 3D volume is 68 %. The left  ventricle has normal function. The left ventricle has no regional wall motion abnormalities. Left ventricular diastolic parameters are consistent with Grade I diastolic dysfunction (impaired relaxation). The average left ventricular global longitudinal strain is -20.9 %. The global longitudinal strain is normal. 2. Right ventricular systolic function is normal. The right ventricular size is normal. 3. Left atrial size was mild to moderately dilated. 4. Right atrial size was mild to moderately dilated. 5. The mitral valve is normal in structure. Mild mitral valve regurgitation. No evidence of mitral stenosis. 6. The aortic valve is normal in structure. There is mild calcification of the aortic valve. Aortic valve regurgitation is not visualized. Aortic valve sclerosis/calcification is present, without any evidence of aortic stenosis.  FINDINGS Left Ventricle: Left ventricular ejection fraction, by estimation, is 60 to 65%. Left ventricular ejection fraction by 3D volume is 68 %. The left ventricle has normal function. The left ventricle has no regional wall motion abnormalities. The average left ventricular global longitudinal strain is -20.9 %. The global longitudinal strain is normal. The left ventricular internal cavity size was normal in size. There is borderline left ventricular hypertrophy. Left ventricular diastolic parameters are consistent with Grade I diastolic dysfunction (impaired relaxation). Normal left ventricular filling pressure.  Right Ventricle: The right ventricular size is normal. No increase in right ventricular wall thickness. Right ventricular systolic function is normal.  Left Atrium: Left atrial size was mild to moderately dilated.  Right Atrium: Right atrial size was mild to moderately dilated.  Pericardium: There is no evidence of pericardial effusion.  Mitral Valve: The mitral valve is normal in structure. Mild mitral valve regurgitation. No evidence of mitral valve  stenosis.  Tricuspid Valve: The tricuspid valve is normal in structure. Tricuspid valve regurgitation is mild . No evidence of tricuspid stenosis.  Aortic Valve: The aortic valve is normal in structure. There is mild calcification of the aortic valve. Aortic valve regurgitation is not visualized. Aortic valve sclerosis/calcification is present, without any evidence of aortic stenosis. Aortic valve mean gradient measures 12.2 mmHg. Aortic valve peak gradient measures 20.2 mmHg. Aortic valve area, by VTI measures 2.26 cm.  Pulmonic Valve: The pulmonic valve was normal in structure. Pulmonic valve regurgitation is not visualized. No evidence of pulmonic stenosis.  Aorta: The aortic root and ascending aorta are structurally normal, with no evidence of dilitation and the aortic arch was not well visualized.  Venous: The pulmonary veins were not well visualized. The inferior vena cava was not well visualized.  IAS/Shunts: No atrial level shunt detected by color flow Doppler.   LEFT VENTRICLE PLAX 2D LVIDd:         5.80 cm         Diastology LVIDs:         3.90 cm  LV e' medial:    6.96 cm/s LV PW:         1.00 cm         LV E/e' medial:  14.1 LV IVS:        1.10 cm         LV e' lateral:   9.90 cm/s LVOT diam:     2.30 cm         LV E/e' lateral: 9.9 LV SV:         128 LV SV Index:   73              2D LVOT Area:     4.15 cm        Longitudinal Strain 2D Strain GLS  -20.9 % Avg:  3D Volume EF LV 3D EF:    Left ventricul ar ejection fraction by 3D volume is 68 %.  3D Volume EF: 3D EF:        68 % LV EDV:       168 ml LV ESV:       54 ml LV SV:        114 ml  RIGHT VENTRICLE             IVC RV S prime:     22.20 cm/s  IVC diam: 2.20 cm TAPSE (M-mode): 3.5 cm  LEFT ATRIUM             Index        RIGHT ATRIUM           Index LA diam:        4.10 cm 2.34 cm/m   RA Area:     15.70 cm LA Vol (A2C):   79.5 ml 45.29 ml/m  RA Volume:   41.10 ml  23.41 ml/m LA Vol  (A4C):   63.0 ml 35.89 ml/m LA Biplane Vol: 71.9 ml 40.96 ml/m AORTIC VALVE AV Area (Vmax):    2.24 cm AV Area (Vmean):   2.23 cm AV Area (VTI):     2.26 cm AV Vmax:           224.50 cm/s AV Vmean:          165.500 cm/s AV VTI:            0.565 m AV Peak Grad:      20.2 mmHg AV Mean Grad:      12.2 mmHg LVOT Vmax:         121.00 cm/s LVOT Vmean:        88.900 cm/s LVOT VTI:          0.308 m LVOT/AV VTI ratio: 0.55  AORTA Ao Root diam: 3.60 cm Ao Asc diam:  3.60 cm  MITRAL VALVE               TRICUSPID VALVE MV Area (PHT): 7.16 cm    TR Peak grad:   21.5 mmHg MV Decel Time: 106 msec    TR Vmax:        232.00 cm/s MR Peak grad: 149.3 mmHg MR Mean grad: 109.0 mmHg   SHUNTS MR Vmax:      611.00 cm/s  Systemic VTI:  0.31 m MR Vmean:     502.0 cm/s   Systemic Diam: 2.30 cm MV E velocity: 98.10 cm/s MV A velocity: 90.00 cm/s MV E/A ratio:  1.09  Norman Herrlich MD Electronically signed by Norman Herrlich MD Signature Date/Time: 09/12/2022/5:47:23 PM    Final  Risk Assessment/Calculations:    CHA2DS2-VASc Score = 5   This indicates a 7.2% annual risk of stroke. The patient's score is based upon: CHF History: 0 HTN History: 1 Diabetes History: 1 Stroke History: 0 Vascular Disease History: 1 Age Score: 2 Gender Score: 0            Physical Exam:   VS:  BP (!) 108/48   Pulse (!) 56   Ht 5\' 3"  (1.6 m)   Wt 150 lb (68 kg)   SpO2 99%   BMI 26.57 kg/m    Wt Readings from Last 3 Encounters:  09/20/23 150 lb (68 kg)  09/06/23 150 lb 12.8 oz (68.4 kg)  08/23/23 170 lb 6.4 oz (77.3 kg)    GEN: Well nourished, well developed in no acute distress NECK: No JVD; No carotid bruits CARDIAC: RRR, no murmurs, rubs, gallops RESPIRATORY:  Clear to auscultation without rales, wheezing or rhonchi  ABDOMEN: Soft, non-tender, non-distended EXTREMITIES:  No edema; No deformity   ASSESSMENT AND PLAN: .   Paroxysmal atrial fibrillation/hypercoagulable state/on  Amiodarone therapy-evaluated by Dr. Dulce Sellar 4 weeks ago, he was noted to be back in atrial fibrillation and his amiodarone dose was increased.  Today he is in sinus rhythm, will continue amiodarone 200 mg daily--I will speak with Dr. Dulce Sellar to see if he wants to decrease his dose or continue on current dosage.  CHA2DS2-VASc score is 5, continue Eliquis 5 mg twice daily--no indication for dose reduction--he will see his nephrologist on Monday and they will be checking blood work.  Hypertension-blood pressure was initially elevated however upon recheck it was well-controlled at 108/48, continue Norvasc 10 mg daily, continue Lotensin 20 mg daily.  CAD-s/p PCI in 1989, currently on aspirin 81 mg daily, Imdur 30 mg daily, nitroglycerin as needed, Pravachol 10 mg daily. Stable with no anginal symptoms. No indication for ischemic evaluation.    CKD stage IV - follow with Dr. Valentino Nose, will have labs on Monday for their office, will request they send copy to [place].        Dispo: Follow-up with Dr. Dulce Sellar in 6 weeks.  Signed, Flossie Dibble, NP

## 2023-09-20 ENCOUNTER — Encounter: Payer: Self-pay | Admitting: Cardiology

## 2023-09-20 ENCOUNTER — Ambulatory Visit: Payer: PPO | Attending: Cardiology | Admitting: Cardiology

## 2023-09-20 VITALS — BP 108/48 | HR 56 | Ht 63.0 in | Wt 150.0 lb

## 2023-09-20 DIAGNOSIS — D6859 Other primary thrombophilia: Secondary | ICD-10-CM

## 2023-09-20 DIAGNOSIS — I10 Essential (primary) hypertension: Secondary | ICD-10-CM | POA: Diagnosis not present

## 2023-09-20 DIAGNOSIS — N184 Chronic kidney disease, stage 4 (severe): Secondary | ICD-10-CM

## 2023-09-20 DIAGNOSIS — I2510 Atherosclerotic heart disease of native coronary artery without angina pectoris: Secondary | ICD-10-CM | POA: Diagnosis not present

## 2023-09-20 DIAGNOSIS — I480 Paroxysmal atrial fibrillation: Secondary | ICD-10-CM

## 2023-09-20 DIAGNOSIS — Z79899 Other long term (current) drug therapy: Secondary | ICD-10-CM

## 2023-09-20 NOTE — Patient Instructions (Signed)
Medication Instructions:  Your physician recommends that you continue on your current medications as directed. Please refer to the Current Medication list given to you today.  *If you need a refill on your cardiac medications before your next appointment, please call your pharmacy*   Lab Work: None Ordered If you have labs (blood work) drawn today and your tests are completely normal, you will receive your results only by: MyChart Message (if you have MyChart) OR A paper copy in the mail If you have any lab test that is abnormal or we need to change your treatment, we will call you to review the results.   Testing/Procedures: None Ordered   Follow-Up: At [facility], you and your health needs are our priority.  As part of our continuing mission to provide you with exceptional heart care, we have created designated Provider Care Teams.  These Care Teams include your primary Cardiologist (physician) and Advanced Practice Providers (APPs -  Physician Assistants and Nurse Practitioners) who all work together to provide you with the care you need, when you need it.  We recommend signing up for the patient portal called "MyChart".  Sign up information is provided on this After Visit Summary.  MyChart is used to connect with patients for Virtual Visits (Telemedicine).  Patients are able to view lab/test results, encounter notes, upcoming appointments, etc.  Non-urgent messages can be sent to your provider as well.   To learn more about what you can do with MyChart, go to [email or web address].    Your next appointment:   6 week(s) with Dr. Dulce Sellar  The format for your next appointment:   In Person  Provider:   Wallis Bamberg, NP   Other Instructions NA

## 2023-09-21 ENCOUNTER — Telehealth: Payer: Self-pay | Admitting: *Deleted

## 2023-09-21 NOTE — Telephone Encounter (Signed)
Spoke with pt and let him know to stop Asa and stay on Amiodarone once daily. Pt verbalized understanding and had no further questions.

## 2023-09-21 NOTE — Telephone Encounter (Signed)
-----   Message from Flossie Dibble sent at 09/21/2023  9:55 AM EST ----- Can you call him and let him know Dr. Dulce Sellar said stay on Amiodarone 200 mg daily, and to dc aspirin.  Thank you. ----- Message ----- From: Baldo Daub, MD Sent: 09/20/2023   5:33 PM EST To: Flossie Dibble, NP  No aspirin, keep on amiodarone 200 mg daily. ----- Message ----- From: Flossie Dibble, NP Sent: 09/20/2023  11:58 AM EST To: Baldo Daub, MD  Back in SR today! He said you mentioned maybe stopping amiodarone-- I told him I needed to double check with you on what to do since it was previously d/c'd and he went back into AF.   Also, should he be on ASA and Eliquis? Looks like PCI was in 1989.   He feels good, sees nephro on Monday and they will check labs

## 2023-09-24 DIAGNOSIS — N184 Chronic kidney disease, stage 4 (severe): Secondary | ICD-10-CM | POA: Diagnosis not present

## 2023-10-02 DIAGNOSIS — E118 Type 2 diabetes mellitus with unspecified complications: Secondary | ICD-10-CM | POA: Diagnosis not present

## 2023-10-02 DIAGNOSIS — Z6826 Body mass index (BMI) 26.0-26.9, adult: Secondary | ICD-10-CM | POA: Diagnosis not present

## 2023-10-02 DIAGNOSIS — Z794 Long term (current) use of insulin: Secondary | ICD-10-CM | POA: Diagnosis not present

## 2023-10-06 ENCOUNTER — Other Ambulatory Visit: Payer: Self-pay | Admitting: Cardiology

## 2023-10-08 NOTE — Telephone Encounter (Signed)
RX sent

## 2023-10-09 DIAGNOSIS — N184 Chronic kidney disease, stage 4 (severe): Secondary | ICD-10-CM | POA: Diagnosis not present

## 2023-10-18 ENCOUNTER — Other Ambulatory Visit: Payer: Self-pay

## 2023-10-18 DIAGNOSIS — I480 Paroxysmal atrial fibrillation: Secondary | ICD-10-CM

## 2023-10-18 MED ORDER — ELIQUIS 5 MG PO TABS: 5.0000 mg | ORAL_TABLET | Freq: Two times a day (BID) | ORAL | 5 refills | Status: DC

## 2023-10-18 NOTE — Telephone Encounter (Signed)
Prescription refill request for Eliquis received. Indication: Afib  Last office visit: 09/20/23 Elliot Gurney)  Scr: 2.84 (08/23/23)  Age: 77 Weight: 68kg  Appropriate dose. Refill sent.

## 2023-10-29 NOTE — Progress Notes (Signed)
 Cardiology Office Note:    Date:  11/01/2023   ID:  Brady [name], DOB 18-Mar-1946, MRN [id number]  PCP:  Conley Dene BROCKS, DO  Cardiologist:  Redell Leiter, MD    Referring MD: Conley Dene BROCKS, DO    ASSESSMENT:    1. Paroxysmal atrial fibrillation (HCC)   2. On amiodarone  therapy   3. Chronic anticoagulation   4. 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 Essential hypertension   6. Coronary artery disease of native artery of native heart with stable angina pectoris (HCC)   7. Mixed hyperlipidemia    PLAN:    In order of problems listed above:  Burns continues to do well maintaining sinus rhythm continue amiodarone  current anticoagulant Hypertension controlled stable CKD Stable CAD continue medical therapy including his statin He is taking spironolactone we will contact him and I think he should stop taking it with stage IV CKD am surprised to see it on his medication list as he was not on it after hospital discharge in October   Next appointment: 6 months   Medication Adjustments/Labs and Tests Ordered: Current medicines are reviewed at length with the patient today.  Concerns regarding medicines are outlined above.  Orders Placed This Encounter  Procedures   EKG 12-Lead   No orders of the defined types were placed in this encounter.    History of Present Illness:    Brady [name] is a 77 y.o. male with a hx of paroxysmal atrial fibrillation on amiodarone  chronically anticoagulated coronary artery disease hypertensive heart disease with stage IV CKD and a history of aortic valve calcification and previous hyponatremia last seen 08/23/2023.  He has had multiple PCI's in the past.  Compliance with diet, lifestyle and medications: Yes  He feels well and is not having cardiovascular symptoms of edema shortness of breath chest pain palpitation or syncope Containing sinus rhythm with amiodarone  recent labs showed no evidence of  liver or thyroid  toxicity 08/13/2023 TSH normal 1.78 cholesterol 129 HDL 54 triglycerides 61 hemoglobin 14.1 creatinine 3.88 potassium 3.9 Past Medical History:  Diagnosis Date   Chronic ischemic heart disease    Chronic kidney disease    Combined hyperlipidemia    Dermatophytoses    nail, failed terbinafine rx   Diabetes mellitus with renal complications (HCC)    Diabetes mellitus without complication (HCC)    Elevated PSA    with prostatic hypertrophy followed by Dr. Renda   Essential hypertension    Hypertension    Hypertensive CKD (chronic kidney disease) 08/25/2022   Obese    Pernicious anemia    Uncontrolled type 2 diabetes mellitus with hyperglycemia, with long-term current use of insulin (HCC)     Current Medications: Current Meds  Medication Sig   amiodarone  (PACERONE ) 200 MG tablet Take 1 tablet (200 mg total) by mouth daily.   amLODipine (NORVASC) 10 MG tablet Take 10 mg by mouth daily.   benazepril (LOTENSIN) 20 MG tablet Take 20 mg by mouth daily.   chlorthalidone (HYGROTON) 25 MG tablet Take 25 mg by mouth every morning.   cloNIDine (CATAPRES) 0.1 MG tablet Take 0.1 mg by mouth 3 (three) times daily.   cyanocobalamin (VITAMIN B12) 1000 MCG tablet Take 2,000 mcg by mouth daily.   ELIQUIS  5 MG TABS tablet Take 1 tablet (5 mg total) by mouth 2 (two) times daily.   finasteride (PROSCAR) 5 MG tablet Take 5 mg by mouth daily.   Insulin Glargine (BASAGLAR KWIKPEN)  100 UNIT/ML Inject 8 Units into the skin in the morning.   insulin lispro (HUMALOG KWIKPEN) 100 UNIT/ML KwikPen Inject 6 Units into the skin in the morning. 8 units prior to lunch and 4 units prior to evening meal   isosorbide  mononitrate (IMDUR ) 30 MG 24 hr tablet Take 1 tablet by mouth daily.   pantoprazole (PROTONIX) 40 MG tablet Take 40 mg by mouth every morning.   pravastatin (PRAVACHOL) 10 MG tablet Take 10 mg by mouth at bedtime.   sodium bicarbonate 650 MG tablet Take 650 mg by mouth 2 (two) times daily.    spironolactone (ALDACTONE) 25 MG tablet Take 25 mg by mouth daily.   sulfamethoxazole-trimethoprim (BACTRIM DS) 800-160 MG tablet Take 1 tablet by mouth 2 (two) times daily.   tamsulosin (FLOMAX) 0.4 MG CAPS capsule Take 0.4 mg by mouth 2 (two) times daily.   torsemide (DEMADEX) 20 MG tablet Take 20 mg by mouth 2 (two) times daily.      EKGs/Labs/Other Studies Reviewed:    The following studies were reviewed today:  Cardiac Studies & Procedures     STRESS TESTS  MYOCARDIAL PERFUSION IMAGING 09/12/2022  Narrative   The study is normal. The study is low risk.   No ST deviation was noted.   Left ventricular function is normal. Nuclear stress EF: 66 %. The left ventricular ejection fraction is hyperdynamic (>65%). End diastolic cavity size is normal.   Prior study not available for comparison.  ECHOCARDIOGRAM  ECHOCARDIOGRAM COMPLETE 09/12/2022  Narrative ECHOCARDIOGRAM REPORT    Patient Name:   Brady [name] Date of Exam: 09/12/2022 Medical Rec #:  [id number]        Height:       64.0 in Accession #:    [id number]       Weight:       155.0 lb Date of Birth:  April 16, 1946         BSA:          1.756 m Patient Age:    76 years         BP:           172/62 mmHg Patient Gender: M                HR:           62 bpm. Exam Location:  [facility]  Procedure: 2D Echo, Cardiac Doppler, Color Doppler and Strain Analysis  Indications:    Nonrheumatic aortic valve stenosis [I35.0 (ICD-10-CM)]; Coronary artery disease of native artery of native heart with stable angina pectoris (HCC) [I25.118 (ICD-10-CM)]; Combined hyperlipidemia [E78.2 (ICD-10-CM)]  History:        Patient has no prior history of Echocardiogram examinations. CAD, Aortic Valve Disease; Risk Factors:Dyslipidemia.  Sonographer:    Charlie Jointer RDCS Referring Phys: [id number] Glennon Kopko J Lakisa Lotz  IMPRESSIONS   1. Left ventricular ejection fraction, by estimation, is 60 to 65%. Left ventricular ejection fraction by 3D  volume is 68 %. The left ventricle has normal function. The left ventricle has no regional wall motion abnormalities. Left ventricular diastolic parameters are consistent with Grade I diastolic dysfunction (impaired relaxation). The average left ventricular global longitudinal strain is -20.9 %. The global longitudinal strain is normal. 2. Right ventricular systolic function is normal. The right ventricular size is normal. 3. Left atrial size was mild to moderately dilated. 4. Right atrial size was mild to moderately dilated. 5. The mitral valve is normal in structure. Mild mitral valve regurgitation. No evidence  of mitral stenosis. 6. The aortic valve is normal in structure. There is mild calcification of the aortic valve. Aortic valve regurgitation is not visualized. Aortic valve sclerosis/calcification is present, without any evidence of aortic stenosis.  FINDINGS Left Ventricle: Left ventricular ejection fraction, by estimation, is 60 to 65%. Left ventricular ejection fraction by 3D volume is 68 %. The left ventricle has normal function. The left ventricle has no regional wall motion abnormalities. The average left ventricular global longitudinal strain is -20.9 %. The global longitudinal strain is normal. The left ventricular internal cavity size was normal in size. There is borderline left ventricular hypertrophy. Left ventricular diastolic parameters are consistent with Grade I diastolic dysfunction (impaired relaxation). Normal left ventricular filling pressure.  Right Ventricle: The right ventricular size is normal. No increase in right ventricular wall thickness. Right ventricular systolic function is normal.  Left Atrium: Left atrial size was mild to moderately dilated.  Right Atrium: Right atrial size was mild to moderately dilated.  Pericardium: There is no evidence of pericardial effusion.  Mitral Valve: The mitral valve is normal in structure. Mild mitral valve regurgitation. No  evidence of mitral valve stenosis.  Tricuspid Valve: The tricuspid valve is normal in structure. Tricuspid valve regurgitation is mild . No evidence of tricuspid stenosis.  Aortic Valve: The aortic valve is normal in structure. There is mild calcification of the aortic valve. Aortic valve regurgitation is not visualized. Aortic valve sclerosis/calcification is present, without any evidence of aortic stenosis. Aortic valve mean gradient measures 12.2 mmHg. Aortic valve peak gradient measures 20.2 mmHg. Aortic valve area, by VTI measures 2.26 cm.  Pulmonic Valve: The pulmonic valve was normal in structure. Pulmonic valve regurgitation is not visualized. No evidence of pulmonic stenosis.  Aorta: The aortic root and ascending aorta are structurally normal, with no evidence of dilitation and the aortic arch was not well visualized.  Venous: The pulmonary veins were not well visualized. The inferior vena cava was not well visualized.  IAS/Shunts: No atrial level shunt detected by color flow Doppler.   LEFT VENTRICLE PLAX 2D LVIDd:         5.80 cm         Diastology LVIDs:         3.90 cm         LV e' medial:    6.96 cm/s LV PW:         1.00 cm         LV E/e' medial:  14.1 LV IVS:        1.10 cm         LV e' lateral:   9.90 cm/s LVOT diam:     2.30 cm         LV E/e' lateral: 9.9 LV SV:         128 LV SV Index:   73              2D LVOT Area:     4.15 cm        Longitudinal Strain 2D Strain GLS  -20.9 % Avg:  3D Volume EF LV 3D EF:    Left ventricul ar ejection fraction by 3D volume is 68 %.  3D Volume EF: 3D EF:        68 % LV EDV:       168 ml LV ESV:       54 ml LV SV:        114 ml  RIGHT VENTRICLE  IVC RV S prime:     22.20 cm/s  IVC diam: 2.20 cm TAPSE (M-mode): 3.5 cm  LEFT ATRIUM             Index        RIGHT ATRIUM           Index LA diam:        4.10 cm 2.34 cm/m   RA Area:     15.70 cm LA Vol (A2C):   79.5 ml 45.29 ml/m  RA Volume:   41.10  ml  23.41 ml/m LA Vol (A4C):   63.0 ml 35.89 ml/m LA Biplane Vol: 71.9 ml 40.96 ml/m AORTIC VALVE AV Area (Vmax):    2.24 cm AV Area (Vmean):   2.23 cm AV Area (VTI):     2.26 cm AV Vmax:           224.50 cm/s AV Vmean:          165.500 cm/s AV VTI:            0.565 m AV Peak Grad:      20.2 mmHg AV Mean Grad:      12.2 mmHg LVOT Vmax:         121.00 cm/s LVOT Vmean:        88.900 cm/s LVOT VTI:          0.308 m LVOT/AV VTI ratio: 0.55  AORTA Ao Root diam: 3.60 cm Ao Asc diam:  3.60 cm  MITRAL VALVE               TRICUSPID VALVE MV Area (PHT): 7.16 cm    TR Peak grad:   21.5 mmHg MV Decel Time: 106 msec    TR Vmax:        232.00 cm/s MR Peak grad: 149.3 mmHg MR Mean grad: 109.0 mmHg   SHUNTS MR Vmax:      611.00 cm/s  Systemic VTI:  0.31 m MR Vmean:     502.0 cm/s   Systemic Diam: 2.30 cm MV E velocity: 98.10 cm/s MV A velocity: 90.00 cm/s MV E/A ratio:  1.09  Redell Leiter MD Electronically signed by Redell Leiter MD Signature Date/Time: 09/12/2022/5:47:23 PM    Final             EKG Interpretation Date/Time:  Thursday November 01 2023 10:43:22 EST Ventricular Rate:  71 PR Interval:  158 QRS Duration:  104 QT Interval:  428 QTC Calculation: 465 R Axis:   -55  Text Interpretation: Normal sinus rhythm Left axis deviation When compared with ECG of 20-Sep-2023 10:31, Premature ventricular complexes are no longer Present Confirmed by Leiter Redell ([id number]) on 11/01/2023 11:02:27 AM   Recent Labs: 08/23/2023: BUN 25; Creatinine, Ser 2.84; NT-Pro BNP 8,697; Potassium 3.9; Sodium 139; TSH 1.780  Recent Lipid Panel No results found for: CHOL, TRIG, HDL, CHOLHDL, VLDL, LDLCALC, LDLDIRECT  Physical Exam:    VS:  BP (!) 116/48   Pulse 71   Ht 5' 4 (1.626 m)   Wt 157 lb 9.6 oz (71.5 kg)   SpO2 96%   BMI 27.05 kg/m     Wt Readings from Last 3 Encounters:  11/01/23 157 lb 9.6 oz (71.5 kg)  09/20/23 150 lb (68 kg)  09/06/23 150 lb 12.8 oz (68.4  kg)     GEN:  Well nourished, well developed in no acute distress HEENT: Normal NECK: No JVD; No carotid bruits LYMPHATICS: No lymphadenopathy CARDIAC: RRR, no murmurs, rubs, gallops RESPIRATORY:  Clear to auscultation without rales, wheezing or rhonchi  ABDOMEN: Soft, non-tender, non-distended MUSCULOSKELETAL:  No edema; No deformity  SKIN: Warm and dry NEUROLOGIC:  Alert and oriented x 3 PSYCHIATRIC:  Normal affect    Signed, Redell Leiter, MD  11/01/2023 11:55 AM    [facility]

## 2023-10-30 ENCOUNTER — Encounter: Payer: Self-pay | Admitting: Cardiology

## 2023-11-01 ENCOUNTER — Ambulatory Visit: Payer: PPO | Attending: Cardiology | Admitting: Cardiology

## 2023-11-01 ENCOUNTER — Encounter: Payer: Self-pay | Admitting: Cardiology

## 2023-11-01 VITALS — BP 116/48 | HR 71 | Ht 64.0 in | Wt 157.6 lb

## 2023-11-01 DIAGNOSIS — I480 Paroxysmal atrial fibrillation: Secondary | ICD-10-CM | POA: Diagnosis not present

## 2023-11-01 DIAGNOSIS — Z79899 Other long term (current) drug therapy: Secondary | ICD-10-CM | POA: Diagnosis not present

## 2023-11-01 DIAGNOSIS — I129 Hypertensive chronic kidney disease with stage 1 through stage 4 chronic kidney disease, or unspecified chronic kidney disease: Secondary | ICD-10-CM | POA: Diagnosis not present

## 2023-11-01 DIAGNOSIS — I25118 Atherosclerotic heart disease of native coronary artery with other forms of angina pectoris: Secondary | ICD-10-CM

## 2023-11-01 DIAGNOSIS — N184 Chronic kidney disease, stage 4 (severe): Secondary | ICD-10-CM

## 2023-11-01 DIAGNOSIS — Z7901 Long term (current) use of anticoagulants: Secondary | ICD-10-CM

## 2023-11-01 DIAGNOSIS — E782 Mixed hyperlipidemia: Secondary | ICD-10-CM

## 2023-11-01 DIAGNOSIS — I10 Essential (primary) hypertension: Secondary | ICD-10-CM

## 2023-11-01 NOTE — Patient Instructions (Signed)

## 2023-11-06 ENCOUNTER — Other Ambulatory Visit: Payer: Self-pay

## 2023-11-06 DIAGNOSIS — Z5986 Financial insecurity: Secondary | ICD-10-CM

## 2023-11-06 NOTE — Progress Notes (Signed)
   11/06/2023  Patient ID: Brady [name], male   DOB: 10-16-1946, 78 y.o.   MRN: [id number]   2025 Medication Assistance Renewal Application Summary:  Patient was outreached regarding medication assistance renewal for 2025. Verified address, anticipated insurance for 2025, and income has not changed. Patient remains interested in PAP for 2025 for Humalog and Basaglar, no other new medications were identified for medication assistance.    Medication Assistance Findings:  Medication assistance needs identified: Humalog and Basaglar     Additional medication assistance options reviewed with patient as warranted:  No other options identified  Plan: I will route patient assistance letter to pharmacy technician who will coordinate patient assistance program application process for medications listed above.  Pharmacy technician will assist with obtaining all required documents from both patient and provider(s) and submit application(s) once completed.    Thank you for allowing pharmacy to be a part of this patient's care. Dorcas Solian, PharmD Clinical Pharmacist Cell: [phone]

## 2023-11-22 DIAGNOSIS — N184 Chronic kidney disease, stage 4 (severe): Secondary | ICD-10-CM | POA: Diagnosis not present

## 2023-11-26 ENCOUNTER — Telehealth: Payer: Self-pay | Admitting: Pharmacy Technician

## 2023-11-26 DIAGNOSIS — Z5986 Financial insecurity: Secondary | ICD-10-CM

## 2023-11-26 NOTE — Progress Notes (Addendum)
Pharmacy Medication Assistance Program Note    11/26/2023  Patient ID: Brady [name], male   DOB: 08-20-1946, 78 y.o.   MRN: [id number]     11/26/2023  Outreach Medication One  Initial Outreach Date (Medication One) 11/21/2023  Manufacturer Medication One Retail buyer Drugs Basaglar  Dose of Basaglar 100 units/ml  Type of Assistance Manufacturer Assistance  Date Application Sent to Patient 11/28/2023  Application Items Requested Application;Proof of Income;Other  Date Application Sent to Prescriber 11/28/2023  Name of Prescriber Casandra Doffing         11/26/2023  Outreach Medication Two  Initial Outreach Date (Medication Two) 11/21/2023  Manufacturer Medication Two Lilly  [employer] Humalog  Dose of Humalog 100 units/ml  Type of Assistance Manufacturer Assistance  Date Application Sent to Patient 11/28/2023  Application Items Requested Application;Proof of Income;Other  Date Application Sent to Prescriber 11/28/2023  Name of Prescriber Casandra Doffing   [facility] 12/12/2023 Successful outreach call to patient in regard to Lilly application for Basaglar and Humalog. HIPAA verified. Patient informs he received the application and will place in mail this week. Will await return of documents.  Pattricia Boss, CPhT [place]  Office: [phone] Fax: [phone] Email: [email or web address]

## 2023-12-27 ENCOUNTER — Telehealth: Payer: Self-pay | Admitting: Pharmacy Technician

## 2023-12-27 DIAGNOSIS — Z5986 Financial insecurity: Secondary | ICD-10-CM

## 2023-12-27 NOTE — Progress Notes (Signed)
 Pharmacy Medication Assistance Program Note    12/27/2023  Patient ID: Brady [name], male   DOB: 1946/02/27, 78 y.o.   MRN: [id number]     11/26/2023 12/27/2023  Outreach Medication One  Initial Outreach Date (Medication One) 11/21/2023   Manufacturer Medication One [employer] Basaglar   Dose of Basaglar 100 units/ml   Type of Assistance Manufacturer Assistance   Date Application Sent to Patient 11/28/2023   Application Items Requested Application;Proof of Income;Other   Date Application Sent to Prescriber 11/28/2023   Name of Prescriber Casandra Doffing   Date Application Received From Patient  12/26/2023  Application Items Received From Patient  Application;Proof of Income;Other  Date Application Received From Provider  12/26/2023  Date Application Submitted to Manufacturer  12/26/2023  Method Application Sent to Manufacturer  Fax       11/26/2023 12/27/2023  Outreach Medication Two  Initial Outreach Date (Medication Two) 11/21/2023   Manufacturer Medication Two Lilly   [employer] [facility] Humalog   Dose of Humalog 100 units/ml   Type of Assistance Manufacturer Assistance   Date Application Sent to Patient 11/28/2023   Application Items Requested Application;Proof of Income;Other   Date Application Sent to Prescriber 11/28/2023   Name of Prescriber Casandra Doffing   Date Application Received From Patient  12/26/2023  Application Items Received From Patient  Application;Proof of Income;Other  Date Application Received From Provider  12/26/2023  Method Application Sent to Manufacturer  Fax  Date Application Submitted to Manufacturer  12/26/2023    Pattricia Boss, CPhT [place]  Office: [phone] Fax: [phone] Email: [email or web address]

## 2024-01-01 ENCOUNTER — Telehealth: Payer: Self-pay | Admitting: Pharmacy Technician

## 2024-01-01 DIAGNOSIS — Z5986 Financial insecurity: Secondary | ICD-10-CM

## 2024-01-01 NOTE — Progress Notes (Signed)
 Pharmacy Medication Assistance Program Note    01/01/2024  Patient ID: Brady [name], male   DOB: [birth year], 78 y.o.   MRN: [id number]     11/26/2023 12/27/2023 01/01/2024  Outreach Medication One  Initial Outreach Date (Medication One) 11/21/2023    Manufacturer Medication One Print production planner Drugs Basaglar    Dose of Basaglar 100 units/ml    Type of Assistance Manufacturer Assistance    Date Application Sent to Patient 11/28/2023    Application Items Requested Application;Proof of Income;Other    Date Application Sent to Prescriber 11/28/2023    Name of Prescriber Casandra Doffing    Date Application Received From Patient  12/26/2023   Application Items Received From Patient  Application;Proof of Income;Other   Date Application Received From Provider  12/26/2023   Date Application Submitted to Manufacturer  12/26/2023   Method Application Sent to Manufacturer  Fax   Patient Assistance Determination   Approved  Approval Start Date   12/27/2023  Approval End Date   10/29/2024  Patient Notification Method   Telephone Call  Telephone Call Outcome   Left Voicemail       11/26/2023 12/27/2023 01/01/2024  Outreach Medication Two  Initial Outreach Date (Medication Two) 11/21/2023    Manufacturer Medication Two Lilly    [employer] Humalog    Dose of Humalog 100 units/ml    Type of Assistance Manufacturer Assistance    Date Application Sent to Patient 11/28/2023    Application Items Requested Application;Proof of Income;Other    Date Application Sent to Prescriber 11/28/2023    Name of Prescriber Casandra Doffing    Date Application Received From Patient  12/26/2023   Application Items Received From Patient  Application;Proof of Income;Other   Date Application Received From Provider  12/26/2023   Method Application Sent to Manufacturer  Fax   Date Application Submitted to Manufacturer  12/26/2023   Patient Assistance Determination   Approved  Approval Start Date   12/27/2023  Patient Notification  Method   Telephone Call  Telephone Call Outcome   Left Voicemail   Care coordination call placed to Lilly in regard to Basaglar and Humalog application.   Per [employer] IVR system and letter scanned into [facility] EMR, patient is APPROVED 12/27/23-10/29/24. Initial medication orders and subsequent refill orders will process automatically and be delivered to the patient's home. Patient may call [employer] at any time to check on his shipments by calling [phone].  Unsuccessful outreach call placed to patient. HIPAA complaint voicemail left notifying patient of his approvals and requesting a return call. Was calling patient to go over the above information. Will send in basket message to [facility] Pharmacist Tiffany Benfield notifying her of this information as well as case closure as patient assistance is completed.  Pattricia Boss, CPhT [place]  Office: [phone] Fax: [phone] Email: [email or web address]

## 2024-04-03 ENCOUNTER — Encounter: Payer: Self-pay | Admitting: Cardiology

## 2024-04-14 DIAGNOSIS — E1122 Type 2 diabetes mellitus with diabetic chronic kidney disease: Secondary | ICD-10-CM | POA: Diagnosis not present

## 2024-04-14 DIAGNOSIS — N184 Chronic kidney disease, stage 4 (severe): Secondary | ICD-10-CM | POA: Diagnosis not present

## 2024-04-14 DIAGNOSIS — E118 Type 2 diabetes mellitus with unspecified complications: Secondary | ICD-10-CM | POA: Diagnosis not present

## 2024-04-14 DIAGNOSIS — Z794 Long term (current) use of insulin: Secondary | ICD-10-CM | POA: Diagnosis not present

## 2024-04-14 DIAGNOSIS — Z6827 Body mass index (BMI) 27.0-27.9, adult: Secondary | ICD-10-CM | POA: Diagnosis not present

## 2024-04-29 DIAGNOSIS — N184 Chronic kidney disease, stage 4 (severe): Secondary | ICD-10-CM | POA: Diagnosis not present

## 2024-05-06 ENCOUNTER — Ambulatory Visit: Admitting: Cardiology

## 2024-05-07 DIAGNOSIS — I1A0 Resistant hypertension: Secondary | ICD-10-CM | POA: Diagnosis not present

## 2024-05-07 DIAGNOSIS — N184 Chronic kidney disease, stage 4 (severe): Secondary | ICD-10-CM | POA: Diagnosis not present

## 2024-05-07 DIAGNOSIS — E1122 Type 2 diabetes mellitus with diabetic chronic kidney disease: Secondary | ICD-10-CM | POA: Diagnosis not present

## 2024-05-07 DIAGNOSIS — E871 Hypo-osmolality and hyponatremia: Secondary | ICD-10-CM | POA: Diagnosis not present

## 2024-05-07 DIAGNOSIS — I259 Chronic ischemic heart disease, unspecified: Secondary | ICD-10-CM | POA: Diagnosis not present

## 2024-05-07 DIAGNOSIS — E872 Acidosis, unspecified: Secondary | ICD-10-CM | POA: Diagnosis not present

## 2024-05-14 NOTE — Progress Notes (Unsigned)
 Cardiology Office Note:  .   Date:  05/16/2024  ID:  Kadden R [name], DOB 1946/08/11, MRN [id number] PCP: Conley Dene BROCKS, DO  [facility] Providers Cardiologist:  Redell Leiter, MD Cardiology APP:  Carlin Delon BROCKS, NP    History of Present Illness: .   Brady [name] is a 78 y.o. male with a past medical history of PAF on amiodarone  and Eliquis , CAD s/p PCI in 1989, hypertension, DM 2, CKD stage IV--follows with Dr. Macel, dyslipidemia.  08/04/2023 echo EF 60 to 65%, impaired relaxation, mild aortic sclerosis without stenosis, mild TR, PASP mild to moderately elevated at 46 mmHg 09/12/2022 echo EF 60 to 65%, grade 1 DD, LA mild to moderately dilated, RA mild to moderately dilated, mild MR, mild calcification of the aortic valve without stenosis 09/12/2022 Lexiscan  normal, low risk study 1989 PCI   Most recently evaluated by Dr. Leiter on 08/23/2023. He had recently been admitted to [facility] for weakness found to be in atrial fibrillation, he was also hyponatremic, he he converted back to sinus rhythm with amiodarone  therapy.  At his visit with Dr. Leiter he was back in atrial fibrillation, his amiodarone  was increased back to 400 mg a day for 2 weeks, plans to follow back up in the office for 4 weeks for repeat EKG to see if we need to plan for cardioversion.  Most recently he was evaluated by Dr. Leiter on 11/01/2023, he was maintaining sinus rhythm, no changes made to his medications or plan of care he was advised follow-up in 6 months.  He presents today for follow-up of his atrial fibrillation, maintaining sinus rhythm. Doing well from a cardiac perspective and without formal complaints. Does complain of intermittent dizziness, he thinks from positional changes. He denies chest pain, palpitations, dyspnea, pnd, orthopnea, n, v, syncope, edema, weight gain, or early satiety.   ROS: Review of Systems  Neurological:  Positive for dizziness.  Endo/Heme/Allergies:   Bruises/bleeds easily.  All other systems reviewed and are negative.    Studies Reviewed: SABRA   EKG Interpretation Date/Time:  Friday May 16 2024 10:23:16 EDT Ventricular Rate:  61 PR Interval:  166 QRS Duration:  126 QT Interval:  450 QTC Calculation: 453 R Axis:   -43  Text Interpretation: Normal sinus rhythm Left axis deviation Non-specific intra-ventricular conduction block When compared with ECG of 01-Nov-2023 10:43, No significant change was found Confirmed by Carlin Delon [phone]) on 05/16/2024 10:27:46 AM    Cardiac Studies & Procedures   ______________________________________________________________________________________________   STRESS TESTS  MYOCARDIAL PERFUSION IMAGING 09/12/2022  Interpretation Summary   The study is normal. The study is low risk.   No ST deviation was noted.   Left ventricular function is normal. Nuclear stress EF: 66 %. The left ventricular ejection fraction is hyperdynamic (>65%). End diastolic cavity size is normal.   Prior study not available for comparison.   ECHOCARDIOGRAM  ECHOCARDIOGRAM COMPLETE 09/12/2022  Narrative ECHOCARDIOGRAM REPORT    Patient Name:   Brady [name] Date of Exam: 09/12/2022 Medical Rec #:  [id number]        Height:       64.0 in Accession #:    [id number]       Weight:       155.0 lb Date of Birth:  1946-05-22         BSA:          1.756 m Patient Age:    23 years  BP:           172/62 mmHg Patient Gender: M                HR:           62 bpm. 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 CAD, Aortic Valve Disease; Risk  Factors:Dyslipidemia.  Sonographer:    Charlie Jointer RDCS Referring Phys: [id number] BRIAN J MUNLEY  IMPRESSIONS   1. Left ventricular ejection fraction, by estimation, is 60 to 65%. Left ventricular ejection fraction by 3D volume is 68 %. The left ventricle has normal function. The left ventricle has no regional wall motion abnormalities. Left ventricular diastolic parameters are consistent with Grade I diastolic dysfunction (impaired relaxation). The average left ventricular global longitudinal strain is -20.9 %. The global longitudinal strain is normal. 2. Right ventricular systolic function is normal. The right ventricular size is normal. 3. Left atrial size was mild to moderately dilated. 4. Right atrial size was mild to moderately dilated. 5. The mitral valve is normal in structure. Mild mitral valve regurgitation. No evidence of mitral stenosis. 6. The aortic valve is normal in structure. There is mild calcification of the aortic valve. Aortic valve regurgitation is not visualized. Aortic valve sclerosis/calcification is present, without any evidence of aortic stenosis.  FINDINGS Left Ventricle: Left ventricular ejection fraction, by estimation, is 60 to 65%. Left ventricular ejection fraction by 3D volume is 68 %. The left ventricle has normal function. The left ventricle has no regional wall motion abnormalities. The average left ventricular global longitudinal strain is -20.9 %. The global longitudinal strain is normal. The left ventricular internal cavity size was normal in size. There is borderline left ventricular hypertrophy. Left ventricular diastolic parameters are consistent with Grade I diastolic dysfunction (impaired relaxation). Normal left ventricular filling pressure.  Right Ventricle: The right ventricular size is normal. No increase in right ventricular wall thickness. Right ventricular systolic function is normal.  Left Atrium: Left atrial size was mild to moderately  dilated.  Right Atrium: Right atrial size was mild to moderately dilated.  Pericardium: There is no evidence of pericardial effusion.  Mitral Valve: The mitral valve is normal in structure. Mild mitral valve regurgitation. No evidence of mitral valve stenosis.  Tricuspid Valve: The tricuspid valve is normal in structure. Tricuspid valve regurgitation is mild . No evidence of tricuspid stenosis.  Aortic Valve: The aortic valve is normal in structure. There is mild calcification of the aortic valve. Aortic valve regurgitation is not visualized. Aortic valve sclerosis/calcification is present, without any evidence of aortic stenosis. Aortic valve mean gradient measures 12.2 mmHg. Aortic valve peak gradient measures 20.2 mmHg. Aortic valve area, by VTI measures 2.26 cm.  Pulmonic Valve: The pulmonic valve was normal in structure. Pulmonic valve regurgitation is not visualized. No evidence of pulmonic stenosis.  Aorta: The aortic root and ascending aorta are structurally normal, with no evidence of dilitation and the aortic arch was not well visualized.  Venous: The pulmonary veins were not well visualized. The inferior vena cava was not well visualized.  IAS/Shunts: No atrial level shunt detected by color flow Doppler.   LEFT VENTRICLE PLAX 2D LVIDd:  5.80 cm         Diastology LVIDs:         3.90 cm         LV e' medial:    6.96 cm/s LV PW:         1.00 cm         LV E/e' medial:  14.1 LV IVS:        1.10 cm         LV e' lateral:   9.90 cm/s LVOT diam:     2.30 cm         LV E/e' lateral: 9.9 LV SV:         128 LV SV Index:   73              2D LVOT Area:     4.15 cm        Longitudinal Strain 2D Strain GLS  -20.9 % Avg:  3D Volume EF LV 3D EF:    Left ventricul ar ejection fraction by 3D volume is 68 %.  3D Volume EF: 3D EF:        68 % LV EDV:       168 ml LV ESV:       54 ml LV SV:        114 ml  RIGHT VENTRICLE             IVC RV S prime:     22.20 cm/s   IVC diam: 2.20 cm TAPSE (M-mode): 3.5 cm  LEFT ATRIUM             Index        RIGHT ATRIUM           Index LA diam:        4.10 cm 2.34 cm/m   RA Area:     15.70 cm LA Vol (A2C):   79.5 ml 45.29 ml/m  RA Volume:   41.10 ml  23.41 ml/m LA Vol (A4C):   63.0 ml 35.89 ml/m LA Biplane Vol: 71.9 ml 40.96 ml/m AORTIC VALVE AV Area (Vmax):    2.24 cm AV Area (Vmean):   2.23 cm AV Area (VTI):     2.26 cm AV Vmax:           224.50 cm/s AV Vmean:          165.500 cm/s AV VTI:            0.565 m AV Peak Grad:      20.2 mmHg AV Mean Grad:      12.2 mmHg LVOT Vmax:         121.00 cm/s LVOT Vmean:        88.900 cm/s LVOT VTI:          0.308 m LVOT/AV VTI ratio: 0.55  AORTA Ao Root diam: 3.60 cm Ao Asc diam:  3.60 cm  MITRAL VALVE               TRICUSPID VALVE MV Area (PHT): 7.16 cm    TR Peak grad:   21.5 mmHg MV Decel Time: 106 msec    TR Vmax:        232.00 cm/s MR Peak grad: 149.3 mmHg MR Mean grad: 109.0 mmHg   SHUNTS MR Vmax:      611.00 cm/s  Systemic VTI:  0.31 m MR Vmean:     502.0 cm/s   Systemic Diam: 2.30 cm MV E velocity: 98.10  cm/s MV A velocity: 90.00 cm/s MV E/A ratio:  1.09  Redell Leiter MD Electronically signed by Redell Leiter MD Signature Date/Time: 09/12/2022/5:47:23 PM    Final          ______________________________________________________________________________________________      Risk Assessment/Calculations:    CHA2DS2-VASc Score = 5   This indicates a 7.2% annual risk of stroke. The patient's score is based upon: CHF History: 0 HTN History: 1 Diabetes History: 1 Stroke History: 0 Vascular Disease History: 1 Age Score: 2 Gender Score: 0            Physical Exam:   VS:  BP 130/60   Pulse 61   Ht 5' 4 (1.626 m)   Wt 159 lb (72.1 kg)   SpO2 99%   BMI 27.29 kg/m    Wt Readings from Last 3 Encounters:  05/16/24 159 lb (72.1 kg)  11/01/23 157 lb 9.6 oz (71.5 kg)  09/20/23 150 lb (68 kg)    GEN: Well nourished, well  developed in no acute distress NECK: No JVD; No carotid bruits CARDIAC: RRR, no murmurs, rubs, gallops RESPIRATORY:  Clear to auscultation without rales, wheezing or rhonchi  ABDOMEN: Soft, non-tender, non-distended EXTREMITIES:  No edema; No deformity   ASSESSMENT AND PLAN: .   Paroxysmal atrial fibrillation/hypercoagulable state/on Amiodarone  therapy-CHA2DS2-VASc score is 5, continue Eliquis  5 mg twice daily--no indication for dose reduction age of 59 and weight 159 lbs. Continue Amiodarone  200 mg daily -- will check thyroid  panel.   Hypertension-blood pressure is slightly elevated at 130/60, continue Norvasc 10 mg daily, clonidine 0.1 mg three times/day, benazepril 10 mg daily.   HFpEF - NYHA class I euvolemic.  Currently on benazepril 10 mg daily, torsemide 20 mg once a day--we will change this to every other day.  Med list indicates that he is still on spironolactone however with his CKD likely he should not be, will call to check when we result his lab work so he can look at his medications while he is at home.  Further GDMT as Pribula secondary to kidney dysfunction.  CAD-s/p PCI in 1989, currently on aspirin 81 mg daily, Imdur  30 mg daily, nitroglycerin  as needed, Pravachol 10 mg daily. Stable with no anginal symptoms. No indication for ischemic evaluation.    CKD stage IV - follow with Dr. Macel.     Dispo: Thyroid  panel today, decrease Demadex to 20 mg every other day,  Signed, Delon JAYSON Hoover, NP

## 2024-05-16 ENCOUNTER — Ambulatory Visit: Attending: Cardiology | Admitting: Cardiology

## 2024-05-16 ENCOUNTER — Encounter: Payer: Self-pay | Admitting: Cardiology

## 2024-05-16 VITALS — BP 130/60 | HR 61 | Ht 64.0 in | Wt 159.0 lb

## 2024-05-16 DIAGNOSIS — Z7901 Long term (current) use of anticoagulants: Secondary | ICD-10-CM | POA: Diagnosis not present

## 2024-05-16 DIAGNOSIS — N184 Chronic kidney disease, stage 4 (severe): Secondary | ICD-10-CM | POA: Diagnosis not present

## 2024-05-16 DIAGNOSIS — Z79899 Other long term (current) drug therapy: Secondary | ICD-10-CM

## 2024-05-16 DIAGNOSIS — I2510 Atherosclerotic heart disease of native coronary artery without angina pectoris: Secondary | ICD-10-CM

## 2024-05-16 DIAGNOSIS — I5032 Chronic diastolic (congestive) heart failure: Secondary | ICD-10-CM | POA: Diagnosis not present

## 2024-05-16 DIAGNOSIS — I10 Essential (primary) hypertension: Secondary | ICD-10-CM

## 2024-05-16 DIAGNOSIS — I480 Paroxysmal atrial fibrillation: Secondary | ICD-10-CM | POA: Diagnosis not present

## 2024-05-16 MED ORDER — NITROGLYCERIN 0.4 MG SL SUBL: 0.4000 mg | SUBLINGUAL_TABLET | SUBLINGUAL | 3 refills | Status: AC | PRN

## 2024-05-16 NOTE — Patient Instructions (Signed)
 Medication Instructions:   Change: Torsemide (Demadex) 20 mg tablet, take one tablet EVERY OTHER DAY  *If you need a refill on your cardiac medications before your next appointment, please call your pharmacy*  Lab Work: Today: Thyroid  Funtion Panel (THS + T3 + T4 + Free)  If you have labs (blood work) drawn today and your tests are completely normal, you will receive your results only by: MyChart Message (if you have MyChart) OR A paper copy in the mail If you have any lab test that is abnormal or we need to change your treatment, we will call you to review the results.   Follow-Up: At [facility], you and your health needs are our priority.  As part of our continuing mission to provide you with exceptional heart care, our providers are all part of one team.  This team includes your primary Cardiologist (physician) and Advanced Practice Providers or APPs (Physician Assistants and Nurse Practitioners) who all work together to provide you with the care you need, when you need it.  Your next appointment:   6 month(s)  Provider:   Redell Leiter, MD or Delon Hoover, NP [place])   We recommend signing up for the patient portal called MyChart.  Sign up information is provided on this After Visit Summary.  MyChart is used to connect with patients for Virtual Visits (Telemedicine).  Patients are able to view lab/test results, encounter notes, upcoming appointments, etc.  Non-urgent messages can be sent to your provider as well.   To learn more about what you can do with MyChart, go to [email or web address].

## 2024-05-24 ENCOUNTER — Ambulatory Visit: Payer: Self-pay | Admitting: Cardiology

## 2024-05-24 LAB — TSH+T3+FREE T4+T3 FREE
Free T-3: 2.6 pg/mL
Free T4 by Dialysis: 1.8 ng/dL — ABNORMAL HIGH
TSH: 0.8 uU/mL
Triiodothyronine (T-3), Serum: 55 ng/dL

## 2024-07-18 ENCOUNTER — Other Ambulatory Visit: Payer: Self-pay | Admitting: Cardiology

## 2024-07-18 NOTE — Telephone Encounter (Signed)
*  STAT* If patient is at the pharmacy, call can be transferred to refill team.   1. Which medications need to be refilled? (please list name of each medication and dose if known) sodium bicarbonate 650 MG tablet    2. Would you like to learn more about the convenience, safety, & potential cost savings by using the [facility]? No   3. Are you open to using the [facility] (Type [facility]. NO   4. Which pharmacy/location (including street and city if local pharmacy) is medication to be sent to? [facility] - [place], [place] - [address]    5. Do they need a 30 day or 90 day supply? 30 day

## 2024-07-18 NOTE — Telephone Encounter (Signed)
 Pt is requesting a refill on non cardiac medication sodium bicarbonate 650 mg tablet. Would Dr. Monetta like to refill this non cardiac medication? Please address

## 2024-07-21 DIAGNOSIS — Z794 Long term (current) use of insulin: Secondary | ICD-10-CM | POA: Diagnosis not present

## 2024-07-21 DIAGNOSIS — E1122 Type 2 diabetes mellitus with diabetic chronic kidney disease: Secondary | ICD-10-CM | POA: Diagnosis not present

## 2024-07-21 DIAGNOSIS — E118 Type 2 diabetes mellitus with unspecified complications: Secondary | ICD-10-CM | POA: Diagnosis not present

## 2024-07-21 DIAGNOSIS — N184 Chronic kidney disease, stage 4 (severe): Secondary | ICD-10-CM | POA: Diagnosis not present

## 2024-07-21 DIAGNOSIS — Z2821 Immunization not carried out because of patient refusal: Secondary | ICD-10-CM | POA: Diagnosis not present

## 2024-07-21 DIAGNOSIS — E1140 Type 2 diabetes mellitus with diabetic neuropathy, unspecified: Secondary | ICD-10-CM | POA: Diagnosis not present

## 2024-07-29 DIAGNOSIS — N184 Chronic kidney disease, stage 4 (severe): Secondary | ICD-10-CM | POA: Diagnosis not present

## 2024-08-06 DIAGNOSIS — N184 Chronic kidney disease, stage 4 (severe): Secondary | ICD-10-CM | POA: Diagnosis not present

## 2024-08-06 DIAGNOSIS — E1122 Type 2 diabetes mellitus with diabetic chronic kidney disease: Secondary | ICD-10-CM | POA: Diagnosis not present

## 2024-08-06 DIAGNOSIS — I1A0 Resistant hypertension: Secondary | ICD-10-CM | POA: Diagnosis not present

## 2024-08-06 DIAGNOSIS — E872 Acidosis, unspecified: Secondary | ICD-10-CM | POA: Diagnosis not present

## 2024-08-06 DIAGNOSIS — I259 Chronic ischemic heart disease, unspecified: Secondary | ICD-10-CM | POA: Diagnosis not present

## 2024-08-06 DIAGNOSIS — E871 Hypo-osmolality and hyponatremia: Secondary | ICD-10-CM | POA: Diagnosis not present

## 2024-08-26 ENCOUNTER — Telehealth: Payer: Self-pay

## 2024-08-26 NOTE — Telephone Encounter (Addendum)
 2026 Renewal  PAP: Patient assistance application for Basaglar and Humalog through [employer] has been mailed to pt's home address on file. Provider portion of application will be faxed to provider's office.  PAP: Patient assistance application for Farxiga through [employer] ([employer]) has been mailed to pt's home address on file. Provider portion of application will be faxed to provider's office.   The provider portion of the application will be faxed to Dr. Chad Nabors at [facility]  Application will be mailed 11/4

## 2024-09-01 NOTE — Telephone Encounter (Signed)
 Received provider portion of patient assistance application

## 2024-09-08 ENCOUNTER — Other Ambulatory Visit: Payer: Self-pay

## 2024-09-08 MED ORDER — AMIODARONE HCL 200 MG PO TABS: 200.0000 mg | ORAL_TABLET | Freq: Every day | ORAL | 1 refills | Status: AC

## 2024-10-08 NOTE — Telephone Encounter (Signed)
 Spoke with patient regarding 2026 Renewal applications mailed 11/4.  Patient says he out in mail earlier this week.

## 2024-10-09 ENCOUNTER — Telehealth: Payer: Self-pay | Admitting: Cardiology

## 2024-10-09 ENCOUNTER — Other Ambulatory Visit: Payer: Self-pay

## 2024-10-09 DIAGNOSIS — I480 Paroxysmal atrial fibrillation: Secondary | ICD-10-CM

## 2024-10-09 MED ORDER — ELIQUIS 5 MG PO TABS: 5.0000 mg | ORAL_TABLET | Freq: Two times a day (BID) | ORAL | 5 refills | Status: AC

## 2024-10-09 NOTE — Telephone Encounter (Signed)
°*  STAT* If patient is at the pharmacy, call can be transferred to refill team.   1. Which medications need to be refilled? (please list name of each medication and dose if known)   ELIQUIS  5 MG TABS tablet     2. Would you like to learn more about the convenience, safety, & potential cost savings by using the [facility]? No    3. Are you open to using the [facility] ([facility]. No   4. Which pharmacy/location (including street and city if local pharmacy) is medication to be sent to? [facility] - [place], [place] - [address]     5. Do they need a 30 day or 90 day supply? 30 day

## 2024-10-09 NOTE — Telephone Encounter (Signed)
 Prescription refill request for Eliquis  received. Indication:afib Last office visit:7/25 Scr: 4.93  2025 Age:78 Weight:72.1  kg  Prescription refilled

## 2024-10-13 NOTE — Telephone Encounter (Signed)
 PAP: Application for Brady [name] has been submitted to [employer] ([employer]), via fax  PAP: Application for Basaglar and Humalog has been submitted to [employer], via fax

## 2024-10-16 NOTE — Telephone Encounter (Signed)
 PAP: Patient assistance application for Farxiga has been approved by PAP Companies: [employer] from 1/1/20026 to 10/29/2025. Medication should be delivered to PAP Delivery: Home. For further shipping updates, please contact [employer] ([employer]) at [phone]. Patient ID is: [id number]

## 2024-10-21 NOTE — Telephone Encounter (Signed)
 PAP: Patient assistance application for Basaglar and Humalog has been approved by PAP Companies: [employer] from 1/1/20026 to 10/29/2025. Medication should be delivered to PAP Delivery: Home. For further shipping updates, please contact [employer] at [phone]. Patient ID is: waiting on approval letter

## 2024-12-01 ENCOUNTER — Other Ambulatory Visit: Payer: Self-pay

## 2024-12-03 ENCOUNTER — Ambulatory Visit: Admitting: Cardiology

## 2025-01-26 ENCOUNTER — Ambulatory Visit: Admitting: Cardiology
